# Patient Record
Sex: Female | Born: 1955 | Race: Black or African American | Hispanic: No | Smoking: Never smoker
Health system: Southern US, Community
[De-identification: ages and names within clinical notes are randomized; demographics above are authoritative.]

## PROBLEM LIST (undated history)

## (undated) DIAGNOSIS — M8580 Other specified disorders of bone density and structure, unspecified site: Secondary | ICD-10-CM

## (undated) DIAGNOSIS — E785 Hyperlipidemia, unspecified: Secondary | ICD-10-CM

## (undated) DIAGNOSIS — H269 Unspecified cataract: Secondary | ICD-10-CM

## (undated) HISTORY — PX: OTHER SURGICAL HISTORY: SHX169

## (undated) HISTORY — DX: Unspecified cataract: H26.9

## (undated) HISTORY — PX: TOTAL ABDOMINAL HYSTERECTOMY: SHX209

## (undated) HISTORY — DX: Hyperlipidemia, unspecified: E78.5

## (undated) HISTORY — PX: CATARACT EXTRACTION: SUR2

## (undated) HISTORY — DX: Other specified disorders of bone density and structure, unspecified site: M85.80

---

## 1999-03-03 ENCOUNTER — Inpatient Hospital Stay (HOSPITAL_COMMUNITY): Admission: RE | Admit: 1999-03-03 | Discharge: 1999-03-05 | Payer: Self-pay | Admitting: Obstetrics and Gynecology

## 1999-11-18 ENCOUNTER — Encounter: Payer: Self-pay | Admitting: Internal Medicine

## 1999-11-18 ENCOUNTER — Encounter: Admission: RE | Admit: 1999-11-18 | Discharge: 1999-11-18 | Payer: Self-pay | Admitting: Internal Medicine

## 2000-11-19 ENCOUNTER — Encounter: Payer: Self-pay | Admitting: Internal Medicine

## 2000-11-19 ENCOUNTER — Encounter: Admission: RE | Admit: 2000-11-19 | Discharge: 2000-11-19 | Payer: Self-pay | Admitting: Internal Medicine

## 2003-01-15 ENCOUNTER — Encounter: Admission: RE | Admit: 2003-01-15 | Discharge: 2003-01-15 | Payer: Self-pay | Admitting: Internal Medicine

## 2003-01-15 ENCOUNTER — Encounter: Payer: Self-pay | Admitting: Internal Medicine

## 2004-11-10 ENCOUNTER — Ambulatory Visit: Payer: Self-pay | Admitting: Internal Medicine

## 2007-11-09 ENCOUNTER — Telehealth: Payer: Self-pay | Admitting: Internal Medicine

## 2007-11-11 ENCOUNTER — Encounter: Payer: Self-pay | Admitting: Internal Medicine

## 2007-11-14 ENCOUNTER — Ambulatory Visit: Payer: Self-pay | Admitting: Internal Medicine

## 2007-11-14 ENCOUNTER — Telehealth: Payer: Self-pay | Admitting: Internal Medicine

## 2007-11-14 DIAGNOSIS — E782 Mixed hyperlipidemia: Secondary | ICD-10-CM

## 2007-11-14 DIAGNOSIS — E7800 Pure hypercholesterolemia, unspecified: Secondary | ICD-10-CM | POA: Insufficient documentation

## 2007-11-14 LAB — CONVERTED CEMR LAB: Vit D, 1,25-Dihydroxy: 22 — ABNORMAL LOW (ref 30–89)

## 2007-11-21 ENCOUNTER — Ambulatory Visit: Payer: Self-pay | Admitting: Gastroenterology

## 2007-11-21 ENCOUNTER — Encounter (INDEPENDENT_AMBULATORY_CARE_PROVIDER_SITE_OTHER): Payer: Self-pay | Admitting: *Deleted

## 2007-11-21 LAB — CONVERTED CEMR LAB
ALT: 18 units/L (ref 0–35)
AST: 18 units/L (ref 0–37)
Albumin: 4.1 g/dL (ref 3.5–5.2)
Alkaline Phosphatase: 43 units/L (ref 39–117)
BUN: 11 mg/dL (ref 6–23)
Basophils Absolute: 0 10*3/uL (ref 0.0–0.1)
Basophils Relative: 0.7 % (ref 0.0–1.0)
Bilirubin, Direct: 0.1 mg/dL (ref 0.0–0.3)
CO2: 28 meq/L (ref 19–32)
Calcium: 9.5 mg/dL (ref 8.4–10.5)
Chloride: 102 meq/L (ref 96–112)
Cholesterol: 245 mg/dL (ref 0–200)
Creatinine, Ser: 0.8 mg/dL (ref 0.4–1.2)
Direct LDL: 171.8 mg/dL
Eosinophils Absolute: 0.2 10*3/uL (ref 0.0–0.6)
Eosinophils Relative: 3.4 % (ref 0.0–5.0)
GFR calc Af Amer: 96 mL/min
GFR calc non Af Amer: 80 mL/min
Glucose, Bld: 97 mg/dL (ref 70–99)
HCT: 42.3 % (ref 36.0–46.0)
HDL: 53.2 mg/dL (ref >39.0)
Hemoglobin: 14.8 g/dL (ref 12.0–15.0)
Lymphocytes Relative: 32.9 % (ref 12.0–46.0)
MCHC: 35 g/dL (ref 30.0–36.0)
MCV: 90.4 fL (ref 78.0–100.0)
Monocytes Absolute: 0.4 10*3/uL (ref 0.2–0.7)
Monocytes Relative: 6.4 % (ref 3.0–11.0)
Neutro Abs: 4 10*3/uL (ref 1.4–7.7)
Neutrophils Relative %: 56.6 % (ref 43.0–77.0)
Platelets: 263 10*3/uL (ref 150–400)
Potassium: 4 meq/L (ref 3.5–5.1)
RBC: 4.68 M/uL (ref 3.87–5.11)
RDW: 11.9 % (ref 11.5–14.6)
Sodium: 139 meq/L (ref 135–145)
TSH: 1.29 microintl units/mL (ref 0.35–5.50)
Total Bilirubin: 0.7 mg/dL (ref 0.3–1.2)
Total CHOL/HDL Ratio: 4.6
Total Protein: 7.8 g/dL (ref 6.0–8.3)
Triglycerides: 72 mg/dL (ref 0–149)
VLDL: 14 mg/dL (ref 0–40)
WBC: 6.9 10*3/uL (ref 4.5–10.5)

## 2007-11-30 ENCOUNTER — Ambulatory Visit: Payer: Self-pay | Admitting: Gastroenterology

## 2007-11-30 ENCOUNTER — Encounter: Payer: Self-pay | Admitting: Gastroenterology

## 2007-11-30 ENCOUNTER — Encounter: Payer: Self-pay | Admitting: Internal Medicine

## 2007-11-30 LAB — HM COLONOSCOPY

## 2008-03-28 ENCOUNTER — Telehealth (INDEPENDENT_AMBULATORY_CARE_PROVIDER_SITE_OTHER): Payer: Self-pay | Admitting: *Deleted

## 2008-11-12 ENCOUNTER — Ambulatory Visit: Payer: Self-pay | Admitting: Internal Medicine

## 2008-11-12 DIAGNOSIS — R030 Elevated blood-pressure reading, without diagnosis of hypertension: Secondary | ICD-10-CM | POA: Insufficient documentation

## 2008-11-12 DIAGNOSIS — M8580 Other specified disorders of bone density and structure, unspecified site: Secondary | ICD-10-CM | POA: Insufficient documentation

## 2008-11-12 LAB — CONVERTED CEMR LAB
Cholesterol, target level: 200 mg/dL
HDL goal, serum: 50 mg/dL
LDL Goal: 135 mg/dL

## 2008-11-13 ENCOUNTER — Ambulatory Visit: Payer: Self-pay | Admitting: Family Medicine

## 2008-11-13 ENCOUNTER — Encounter: Payer: Self-pay | Admitting: Internal Medicine

## 2008-11-14 ENCOUNTER — Encounter (INDEPENDENT_AMBULATORY_CARE_PROVIDER_SITE_OTHER): Payer: Self-pay | Admitting: *Deleted

## 2008-11-14 LAB — CONVERTED CEMR LAB: Vit D, 1,25-Dihydroxy: 35 (ref 30–89)

## 2008-11-15 ENCOUNTER — Telehealth (INDEPENDENT_AMBULATORY_CARE_PROVIDER_SITE_OTHER): Payer: Self-pay | Admitting: *Deleted

## 2008-11-15 LAB — CONVERTED CEMR LAB
ALT: 15 units/L (ref 0–35)
AST: 18 units/L (ref 0–37)
Albumin: 4 g/dL (ref 3.5–5.2)
Alkaline Phosphatase: 53 units/L (ref 39–117)
BUN: 13 mg/dL (ref 6–23)
Basophils Absolute: 0.1 10*3/uL (ref 0.0–0.1)
Basophils Relative: 0.9 % (ref 0.0–3.0)
Bilirubin, Direct: 0.1 mg/dL (ref 0.0–0.3)
CO2: 32 meq/L (ref 19–32)
Calcium: 9.8 mg/dL (ref 8.4–10.5)
Chloride: 105 meq/L (ref 96–112)
Cholesterol: 223 mg/dL (ref 0–200)
Creatinine, Ser: 0.8 mg/dL (ref 0.4–1.2)
Direct LDL: 162.6 mg/dL
Eosinophils Absolute: 0.3 10*3/uL (ref 0.0–0.7)
Eosinophils Relative: 3.6 % (ref 0.0–5.0)
GFR calc Af Amer: 96 mL/min
GFR calc non Af Amer: 79 mL/min
Glucose, Bld: 103 mg/dL — ABNORMAL HIGH (ref 70–99)
HCT: 42.7 % (ref 36.0–46.0)
HDL: 44.1 mg/dL (ref >39.0)
Hemoglobin: 14.8 g/dL (ref 12.0–15.0)
Hgb A1c MFr Bld: 5.8 % (ref 4.6–6.0)
Lymphocytes Relative: 34.4 % (ref 12.0–46.0)
MCHC: 34.6 g/dL (ref 30.0–36.0)
MCV: 90.8 fL (ref 78.0–100.0)
Monocytes Absolute: 0.5 10*3/uL (ref 0.1–1.0)
Monocytes Relative: 6.8 % (ref 3.0–12.0)
Neutro Abs: 3.8 10*3/uL (ref 1.4–7.7)
Neutrophils Relative %: 54.3 % (ref 43.0–77.0)
Platelets: 271 10*3/uL (ref 150–400)
Potassium: 4.8 meq/L (ref 3.5–5.1)
RBC: 4.7 M/uL (ref 3.87–5.11)
RDW: 11.7 % (ref 11.5–14.6)
Sodium: 142 meq/L (ref 135–145)
TSH: 1.13 microintl units/mL (ref 0.35–5.50)
Total Bilirubin: 1 mg/dL (ref 0.3–1.2)
Total CHOL/HDL Ratio: 5.1
Total Protein: 7.8 g/dL (ref 6.0–8.3)
Triglycerides: 75 mg/dL (ref 0–149)
VLDL: 15 mg/dL (ref 0–40)
WBC: 7.2 10*3/uL (ref 4.5–10.5)

## 2008-11-19 ENCOUNTER — Encounter (INDEPENDENT_AMBULATORY_CARE_PROVIDER_SITE_OTHER): Payer: Self-pay | Admitting: *Deleted

## 2008-12-10 ENCOUNTER — Encounter (INDEPENDENT_AMBULATORY_CARE_PROVIDER_SITE_OTHER): Payer: Self-pay | Admitting: *Deleted

## 2010-08-14 ENCOUNTER — Ambulatory Visit: Payer: Self-pay | Admitting: Internal Medicine

## 2010-08-14 DIAGNOSIS — E559 Vitamin D deficiency, unspecified: Secondary | ICD-10-CM | POA: Insufficient documentation

## 2010-08-15 ENCOUNTER — Ambulatory Visit: Payer: Self-pay | Admitting: Internal Medicine

## 2010-08-15 ENCOUNTER — Encounter: Admission: RE | Admit: 2010-08-15 | Discharge: 2010-08-15 | Payer: Self-pay | Admitting: Obstetrics and Gynecology

## 2010-08-18 LAB — CONVERTED CEMR LAB
Albumin: 4 g/dL (ref 3.5–5.2)
Alkaline Phosphatase: 49 units/L (ref 39–117)
Basophils Absolute: 0.1 10*3/uL (ref 0.0–0.1)
Basophils Relative: 1.1 % (ref 0.0–3.0)
Bilirubin, Direct: 0.1 mg/dL (ref 0.0–0.3)
CO2: 28 meq/L (ref 19–32)
Calcium: 9.6 mg/dL (ref 8.4–10.5)
Cholesterol: 214 mg/dL — ABNORMAL HIGH (ref 0–200)
Creatinine, Ser: 0.8 mg/dL (ref 0.4–1.2)
GFR calc non Af Amer: 92.72 mL/min (ref >60)
HCT: 40.1 % (ref 36.0–46.0)
HDL: 50.1 mg/dL (ref >39.00)
Hemoglobin: 13.7 g/dL (ref 12.0–15.0)
Lymphocytes Relative: 31.7 % (ref 12.0–46.0)
Lymphs Abs: 2.1 10*3/uL (ref 0.7–4.0)
MCHC: 34 g/dL (ref 30.0–36.0)
Monocytes Relative: 7.1 % (ref 3.0–12.0)
Neutro Abs: 3.6 10*3/uL (ref 1.4–7.7)
RBC: 4.37 M/uL (ref 3.87–5.11)
RDW: 12.6 % (ref 11.5–14.6)
Sodium: 142 meq/L (ref 135–145)
Total CHOL/HDL Ratio: 4
Total Protein: 7 g/dL (ref 6.0–8.3)
Triglycerides: 57 mg/dL (ref 0.0–149.0)

## 2010-09-01 ENCOUNTER — Ambulatory Visit: Payer: Self-pay | Admitting: Internal Medicine

## 2010-09-01 ENCOUNTER — Encounter (INDEPENDENT_AMBULATORY_CARE_PROVIDER_SITE_OTHER): Payer: Self-pay | Admitting: *Deleted

## 2010-09-01 LAB — CONVERTED CEMR LAB
OCCULT 1: NEGATIVE
OCCULT 2: NEGATIVE

## 2010-11-23 HISTORY — PX: COLONOSCOPY: SHX174

## 2010-12-14 ENCOUNTER — Encounter: Payer: Self-pay | Admitting: Obstetrics and Gynecology

## 2010-12-23 NOTE — Letter (Signed)
Summary: Results Follow up Letter  [facility]  [address] [place], [place] [address]   Phone: [phone]  Fax: [phone]    09/01/2010 MRN: [id number]  KORY PANJWANI [address] [place], [place]  [address]  Dear Ms. Michaelsen,  The following are the results of your recent test(s):  Test         Result    Pap Smear:        Normal _____  Not Normal _____ Comments: ______________________________________________________ Cholesterol: LDL(Bad cholesterol):         Your goal is less than:         HDL (Good cholesterol):       Your goal is more than: Comments:  ______________________________________________________ Mammogram:        Normal _____  Not Normal _____ Comments:  ___________________________________________________________________ Hemoccult:        Normal __X___  Not normal _______ Comments:    _____________________________________________________________________ Other Tests:    We routinely do not discuss normal results over the telephone.  If you desire a copy of the results, or you have any questions about this information we can discuss them at your next office visit.   Sincerely,

## 2010-12-23 NOTE — Assessment & Plan Note (Signed)
Summary: cpx/cbs   Vital Signs:  Patient profile:   55 year old female Height:      66.25 inches Weight:      182.6 pounds BMI:     29.36 Temp:     98.5 degrees F oral Pulse rate:   72 / minute Resp:     14 per minute BP sitting:   120 / 78  (left arm) Cuff size:   large  Vitals Entered By: Shonna Chock CMA (August 14, 2010 2:12 PM)  CC: Lipid Management Comments Refused Flu Vaccine- bad reaction in the past    CC:  Lipid Management.  History of Present Illness: Sheila Solomon is here for a physical; she is asymptomatic.  Lipid Management History:      Positive NCEP/ATP III risk factors include female age 13 years old or older.  Negative NCEP/ATP III risk factors include non-diabetic, no family history for ischemic heart disease, non-tobacco-user status, non-hypertensive, no ASHD (atherosclerotic heart disease), no prior stroke/TIA, and no peripheral vascular disease.     Current Medications (verified): 1)  Multivitamin .... Once Daily 2)  Calcium 1000 .Marland Kitchen.. 1 By Mouth Once Daily 3)  Vit E .... Prn 4)  Vitamin D3 1000 Unit Tabs (Cholecalciferol) .Marland Kitchen.. 1 By Mouth Once Daily 5)  Fish Oil 1000 Mg Caps (Omega-3 Fatty Acids) .Marland Kitchen.. 1 By Mouth Once Daily  Allergies (verified): 1)  ! Prednisone (Prednisone)  Past History:  Past Medical History: Hypertension, PMH of ;elevated BP w/o diagnosis of HTN post menopausal status Hyperlipidemia: NMR Lipoprofile 2005: LDL 167(1733/1093), HDL 52, TG 75. LDL goal = < 135. Framingham Study LDL goal = < 160. Osteopenia: BMD 10/2008:T score  -1.2 @  femoral neck; Vitamin D deficiency ( vit D level 22 in 2008)  Past Surgical History: cataract surgery ( laser & implants) bilaterally G 1 P 1 Hysterectomy (TAH)  for fibroids (no BSO) 1997, Dr Elana Alm Colonoscopy 2009: mild colitis (  see Path  report), Dr Russella Dar  Family History: Mother: migraines,HTN, angioplasty, triple bypass @ 34; Father: DM, prostate cancer S/P seed implant; sister: HTN,  DM paternal grandmother: cva @ 96  Social History: Never Smoked Alcohol use-no Occupation: Systems analyst for [employer] Regular exercise: walking & cardio 3X/week  Review of Systems  The patient denies anorexia, fever, weight loss, weight gain, vision loss, decreased hearing, hoarseness, chest pain, syncope, dyspnea on exertion, peripheral edema, prolonged cough, headaches, hemoptysis, abdominal pain, melena, hematochezia, severe indigestion/heartburn, hematuria, suspicious skin lesions, depression, unusual weight change, abnormal bleeding, enlarged lymph nodes, and angioedema.         Occasional vesicular rash on buttocks, < 2X year.  Physical Exam  General:  well-nourished; alert,appropriate and cooperative throughout examination Head:  Normocephalic and atraumatic without obvious abnormalities.  Eyes:  No corneal or conjunctival inflammation noted. Perrla. Funduscopic exam benign, without hemorrhages, exudates or papilledema.  Ears:  External ear exam shows no significant lesions or deformities.  Otoscopic examination reveals clear canals, tympanic membranes are intact bilaterally without bulging, retraction, inflammation or discharge. Hearing is grossly normal bilaterally. Nose:  External nasal examination shows no deformity or inflammation. Nasal mucosa are pink and moist without lesions or exudates. Mouth:  Oral mucosa and oropharynx without lesions or exudates.  Teeth in good repair. Neck:  No deformities, masses, or tenderness noted. Lungs:  Normal respiratory effort, chest expands symmetrically. Lungs are clear to auscultation, no crackles or wheezes. Heart:  Normal rate and regular rhythm. S1 and S2 normal without gallop,  murmur, click, rub.S4 Abdomen:  Bowel sounds positive,abdomen soft and non-tender without masses, organomegaly or hernias noted. Genitalia:  Dr Elana Alm Msk:  No deformity or scoliosis noted of thoracic or lumbar spine.   Pulses:  R and L  carotid,radial,dorsalis pedis and posterior tibial pulses are full and equal bilaterally Extremities:  No clubbing, cyanosis, edema, or deformity noted with normal full range of motion of all joints.   Neurologic:  alert & oriented X3 and DTRs symmetrical and normal.   Skin:  Intact without suspicious lesions or rashes Cervical Nodes:  No lymphadenopathy noted Axillary Nodes:  No palpable lymphadenopathy Psych:  memory intact for recent and remote, normally interactive, and good eye contact.     Impression & Recommendations:  Problem # 1:  ROUTINE GENERAL MEDICAL EXAM@HEALTH  CARE FACL (ICD-V70.0)  Orders: EKG w/ Interpretation (93000)  Problem # 2:  HYPERLIPIDEMIA (ICD-272.2) Statin never started The following medications were removed from the medication list:    Pravastatin Sodium 40 Mg Tabs (Pravastatin sodium) .Marland Kitchen... 1 at bedtime  Problem # 3:  OSTEOPENIA (ICD-733.90) BMD due in 2012  Problem # 4:  VITAMIN D DEFICIENCY (ICD-268.9)  Complete Medication List: 1)  Multivitamin  .... Once daily 2)  Calcium 1000  .Marland KitchenMarland Kitchen. 1 by mouth once daily 3)  Vit E  .... Prn 4)  Vitamin D3 1000 Unit Tabs (Cholecalciferol) .Marland Kitchen.. 1 by mouth once daily 5)  Fish Oil 1000 Mg Caps (Omega-3 fatty acids) .Marland Kitchen.. 1 by mouth once daily  Lipid Assessment/Plan:      Based on NCEP/ATP III, the patient's risk factor category is "0-1 risk factors".  The patient's lipid goals are as follows: Total cholesterol goal is 200; LDL cholesterol goal is 135; HDL cholesterol goal is 50; Triglyceride goal is 150.  Her LDL cholesterol goal has not been met.  Secondary causes for hyperlipidemia have been ruled out.  She has been counseled on adjunctive measures for lowering her cholesterol and has been provided with dietary instructions.    Patient Instructions: 1)  Schedule fasting labs; see Diagnoses for Codes: vitamin D level; 2)  BMP ; 3)  Hepatic Panel ; 4)  Lipid Panel ; 5)  TSH ; 6)  CBC w/ Diff .

## 2011-04-10 NOTE — Assessment & Plan Note (Signed)
[facility]                                 ON-CALL NOTE   Sheila, Solomon                       MRN:          [id number]  DATE:12/31/2007                            DOB:          29-Jul-1956    TIME OF CALL:  9:26 a.m.   CALLER:  Marvelyn Bouchillon   She sees Dr. Alwyn Ren.   PHONE NUMBER:  [phone].   The patient states that she has had several days of congestion in the  head and in the chest with some dry coughing.  No fever.  We offered to  see her in our clinic this morning, but she declined, and instead stated  that she would try something over the counter and drink fluids, and  contact Dr. Alwyn Ren on Monday if she is no better.  I told her she could  certainly call me back this weekend if things changed.     Tera Mater. Clent Ridges, MD  Electronically Signed    SAF/MedQ  DD: 12/31/2007  DT: 01/02/2008  Job #: [id number]

## 2011-04-17 ENCOUNTER — Telehealth: Payer: Self-pay | Admitting: Internal Medicine

## 2011-04-23 NOTE — Telephone Encounter (Signed)
error 

## 2011-06-23 ENCOUNTER — Telehealth: Payer: Self-pay | Admitting: Internal Medicine

## 2011-06-24 NOTE — Telephone Encounter (Signed)
Message left for patient to return my call.  

## 2011-06-25 NOTE — Telephone Encounter (Signed)
Message left for patient to return my call.  

## 2011-06-26 NOTE — Telephone Encounter (Signed)
Discussed form over w/ pt her CPX 08/14/10, which are the dates we put on her form.

## 2011-07-06 ENCOUNTER — Telehealth: Payer: Self-pay | Admitting: Internal Medicine

## 2011-07-07 NOTE — Telephone Encounter (Signed)
Spoke w/ pt needed to have repeat labs for insurance appt scheduled.

## 2011-07-30 ENCOUNTER — Other Ambulatory Visit: Payer: Self-pay | Admitting: Internal Medicine

## 2011-07-30 DIAGNOSIS — Z0000 Encounter for general adult medical examination without abnormal findings: Secondary | ICD-10-CM

## 2011-07-31 ENCOUNTER — Other Ambulatory Visit (INDEPENDENT_AMBULATORY_CARE_PROVIDER_SITE_OTHER): Payer: Managed Care, Other (non HMO)

## 2011-07-31 DIAGNOSIS — Z0000 Encounter for general adult medical examination without abnormal findings: Secondary | ICD-10-CM

## 2011-07-31 LAB — CBC WITH DIFFERENTIAL/PLATELET
Basophils Absolute: 0.1 10*3/uL (ref 0.0–0.1)
HCT: 41 % (ref 36.0–46.0)
Hemoglobin: 13.6 g/dL (ref 12.0–15.0)
Lymphs Abs: 3.2 10*3/uL (ref 0.7–4.0)
MCV: 91.7 fl (ref 78.0–100.0)
Monocytes Absolute: 0.7 10*3/uL (ref 0.1–1.0)
Monocytes Relative: 8.3 % (ref 3.0–12.0)
Neutro Abs: 4.6 10*3/uL (ref 1.4–7.7)
Platelets: 273 10*3/uL (ref 150.0–400.0)
RDW: 12.9 % (ref 11.5–14.6)

## 2011-07-31 LAB — HEPATIC FUNCTION PANEL
ALT: 17 U/L (ref 0–35)
AST: 18 U/L (ref 0–37)
Albumin: 4.1 g/dL (ref 3.5–5.2)
Alkaline Phosphatase: 50 U/L (ref 39–117)
Bilirubin, Direct: 0.1 mg/dL (ref 0.0–0.3)
Total Protein: 7.6 g/dL (ref 6.0–8.3)

## 2011-07-31 LAB — BASIC METABOLIC PANEL
CO2: 29 mEq/L (ref 19–32)
Chloride: 103 mEq/L (ref 96–112)
GFR: 123 mL/min (ref >60.00)
Glucose, Bld: 87 mg/dL (ref 70–99)
Potassium: 3.6 mEq/L (ref 3.5–5.1)
Sodium: 139 mEq/L (ref 135–145)

## 2011-07-31 LAB — LIPID PANEL: HDL: 54.6 mg/dL (ref >39.00)

## 2011-07-31 NOTE — Progress Notes (Signed)
Labs only

## 2011-08-04 NOTE — Progress Notes (Signed)
Labs only

## 2011-08-05 ENCOUNTER — Telehealth: Payer: Self-pay | Admitting: Internal Medicine

## 2011-08-06 NOTE — Telephone Encounter (Signed)
Spoke with patient, patient states she faxed a form 1-2 weeks ago that needs to be filled out based on paperwork and sent to [facility] ASAP, patient stated she is still in town and will stop by to pick up copy of labs

## 2011-08-06 NOTE — Telephone Encounter (Signed)
Form located, completed and faxed to [place] [phone]

## 2012-01-05 ENCOUNTER — Other Ambulatory Visit: Payer: Self-pay | Admitting: Internal Medicine

## 2012-01-05 DIAGNOSIS — Z1231 Encounter for screening mammogram for malignant neoplasm of breast: Secondary | ICD-10-CM

## 2012-01-08 ENCOUNTER — Encounter: Payer: Self-pay | Admitting: Internal Medicine

## 2012-01-08 ENCOUNTER — Ambulatory Visit
Admission: RE | Admit: 2012-01-08 | Discharge: 2012-01-08 | Disposition: A | Discharge status: Home / Self Care | Payer: Managed Care, Other (non HMO) | Source: Ambulatory Visit | Attending: Internal Medicine | Admitting: Internal Medicine

## 2012-01-08 ENCOUNTER — Ambulatory Visit (INDEPENDENT_AMBULATORY_CARE_PROVIDER_SITE_OTHER): Payer: Managed Care, Other (non HMO) | Admitting: Internal Medicine

## 2012-01-08 ENCOUNTER — Ambulatory Visit (INDEPENDENT_AMBULATORY_CARE_PROVIDER_SITE_OTHER)
Admission: RE | Admit: 2012-01-08 | Discharge: 2012-01-08 | Disposition: A | Discharge status: Home / Self Care | Payer: Managed Care, Other (non HMO) | Source: Ambulatory Visit

## 2012-01-08 ENCOUNTER — Encounter: Payer: Managed Care, Other (non HMO) | Admitting: Internal Medicine

## 2012-01-08 DIAGNOSIS — E782 Mixed hyperlipidemia: Secondary | ICD-10-CM

## 2012-01-08 DIAGNOSIS — M949 Disorder of cartilage, unspecified: Secondary | ICD-10-CM

## 2012-01-08 DIAGNOSIS — Z0000 Encounter for general adult medical examination without abnormal findings: Secondary | ICD-10-CM

## 2012-01-08 DIAGNOSIS — E559 Vitamin D deficiency, unspecified: Secondary | ICD-10-CM

## 2012-01-08 DIAGNOSIS — Z1231 Encounter for screening mammogram for malignant neoplasm of breast: Secondary | ICD-10-CM

## 2012-01-08 DIAGNOSIS — IMO0001 Reserved for inherently not codable concepts without codable children: Secondary | ICD-10-CM

## 2012-01-08 LAB — CBC WITH DIFFERENTIAL/PLATELET
Basophils Absolute: 0.1 10*3/uL (ref 0.0–0.1)
HCT: 44.3 % (ref 36.0–46.0)
Hemoglobin: 14.8 g/dL (ref 12.0–15.0)
Lymphs Abs: 3 10*3/uL (ref 0.7–4.0)
MCV: 92 fl (ref 78.0–100.0)
Monocytes Relative: 6.4 % (ref 3.0–12.0)
Neutro Abs: 4.4 10*3/uL (ref 1.4–7.7)
RDW: 13 % (ref 11.5–14.6)

## 2012-01-08 LAB — HEPATIC FUNCTION PANEL
ALT: 20 U/L (ref 0–35)
AST: 19 U/L (ref 0–37)
Albumin: 4.2 g/dL (ref 3.5–5.2)

## 2012-01-08 LAB — BASIC METABOLIC PANEL
CO2: 29 mEq/L (ref 19–32)
Chloride: 103 mEq/L (ref 96–112)
GFR: 102.27 mL/min (ref >60.00)
Glucose, Bld: 84 mg/dL (ref 70–99)
Potassium: 3.9 mEq/L (ref 3.5–5.1)
Sodium: 140 mEq/L (ref 135–145)

## 2012-01-08 LAB — LIPID PANEL
Cholesterol: 222 mg/dL — ABNORMAL HIGH (ref 0–200)
VLDL: 18.6 mg/dL (ref 0.0–40.0)

## 2012-01-08 NOTE — Patient Instructions (Signed)
Preventive Health Care: Exercise  30-45  minutes a day, 3-4 days a week. Walking is especially valuable in preventing Osteoporosis. Health Care Power of Attorney & Living Will place you in charge of your health care  decisions. Verify these are  in place. Eat a low-fat diet with lots of fruits and vegetables, up to 7-9 servings per day. Consume less than 30 (preferably ZERO) grams of sugar per day from foods & drinks with High Fructose Corn Syrup (HFCS) sugar as #1,2,3 or # 4 on label.Whole Foods, [employer] Joes & [employer] do not carry products with HFCS. Follow a  low carb nutrition program such as [place] or The New Sugar Busters  to prevent Diabetes progression . White carbohydrates (potatoes, rice, bread, and pasta) have a high spike of sugar and a high load of sugar. For example a  baked potato has a cup of sugar and a  french fry  2 teaspoons of sugar. Yams, wild  rice, whole grained bread &  wheat pasta have been much lower spike and load of  sugar. Portions should be the size of a deck of cards or your palm.

## 2012-01-08 NOTE — Progress Notes (Signed)
Subjective:    Patient ID: Sheila Solomon, female    DOB: 1956/03/03, 56 y.o.   MRN: [id number]  HPI  Sheila Solomon  is here for a physical; she denies acute issues .      Review of Systems Patient reports no significant  vision/ hearing  changes, adenopathy,fever, weight change,  persistant / recurrent hoarseness , swallowing issues, chest pain,palpitations,edema,persistant /recurrent cough, hemoptysis, dyspnea( rest/ exertional/paroxysmal nocturnal), gastrointestinal bleeding(melena, rectal bleeding), abdominal pain, significant heartburn,  bowel changes,GU symptoms(dysuria, hematuria,pyuria, incontinence), Gyn symptoms(abnormal  bleeding , pain),  syncope, focal weakness, memory loss,numbness & tingling, skin/hair /nail changes,abnormal bruising or bleeding, anxiety,or depression.   She has had some hot flashes; she is using natural supplements as therapy.     Objective:   Physical Exam Gen.: Healthy and well-nourished in appearance. Alert, appropriate and cooperative throughout exam. Head: Normocephalic without obvious abnormalities Eyes: No corneal or conjunctival inflammation noted. Pupils equal round reactive to light and accommodation. Fundal exam is benign without hemorrhages, exudate, papilledema. Extraocular motion intact. Vision grossly normal. Ears: External  ear exam reveals no significant lesions or deformities. Canals clear .TMs normal. Hearing is grossly normal bilaterally. Nose: External nasal exam reveals no deformity or inflammation. Nasal mucosa are pink and moist. No lesions or exudates noted.   Mouth: Oral mucosa and oropharynx reveal no lesions or exudates. Teeth in good repair. Neck: No deformities, masses, or tenderness noted. Range of motion & Thyroid normal. Lungs: Normal respiratory effort; chest expands symmetrically. Lungs are clear to auscultation without rales, wheezes, or increased work of breathing. Heart: Normal rate and rhythm. Normal S1 and S2. No gallop,  click, or rub. S4 with slight slurring at  LSB; no   murmur. Abdomen: Bowel sounds normal; abdomen soft and nontender. No masses, organomegaly or hernias noted. Genitalia: to establish with Gyn   .                                                                                   Musculoskeletal/extremities: No deformity or scoliosis noted of  the thoracic or lumbar spine. No clubbing, cyanosis, edema, or deformity noted. Range of motion  normal .Tone & strength  normal.Joints normal. Nail health  Good. Minor crepitus L knee  Vascular: Carotid, radial artery, dorsalis pedis and  posterior tibial pulses are full and equal. No bruits present. Neurologic: Alert and oriented x3. Deep tendon reflexes symmetrical and normal.          Skin: Intact without suspicious lesions or rashes. Lymph: No cervical, axillary lymphadenopathy present. Psych: Mood and affect are normal. Normally interactive                                                                                        Assessment & Plan:  #1 comprehensive physical exam; no acute findings #2 see Problem List with  Assessments & Recommendations Plan: see Orders

## 2012-01-13 ENCOUNTER — Encounter: Payer: Self-pay | Admitting: Internal Medicine

## 2012-01-13 ENCOUNTER — Telehealth: Payer: Self-pay

## 2012-01-13 DIAGNOSIS — F419 Anxiety disorder, unspecified: Secondary | ICD-10-CM

## 2012-01-13 MED ORDER — PRAVASTATIN SODIUM 20 MG PO TABS: 20.0000 mg | ORAL_TABLET | Freq: Every day | ORAL | Status: DC

## 2012-01-13 NOTE — Telephone Encounter (Signed)
RX sent with copy of form

## 2012-01-13 NOTE — Telephone Encounter (Signed)
Message copied by Maurice Small on Wed Jan 13, 2012  8:32 AM ------      Message from: Pecola Lawless      Created: Sun Jan 10, 2012  9:40 AM       Please send a prescription for Pravastatin 20 mg; dispense 90, daily qhs with labs & her job form

## 2012-02-11 ENCOUNTER — Encounter: Payer: Self-pay | Admitting: Obstetrics and Gynecology

## 2012-10-10 ENCOUNTER — Telehealth: Payer: Self-pay | Admitting: Internal Medicine

## 2012-10-10 NOTE — Telephone Encounter (Signed)
pt called wants a CPE 12.23.13, advised we are in Feb.2014 now for CPE she stated she is always worked in and needs a cpe on this day. If approved where would you like her ?    Cb# [phone]

## 2012-10-11 NOTE — Telephone Encounter (Signed)
Please  schedule fasting Labs that am: BMET,Lipids, hepatic panel, CBC & dif, TSH. CPX @ 4:30 pm . Code: V70.0.

## 2012-10-11 NOTE — Telephone Encounter (Signed)
Done

## 2012-11-14 ENCOUNTER — Encounter: Payer: Self-pay | Admitting: Internal Medicine

## 2012-11-14 ENCOUNTER — Ambulatory Visit (INDEPENDENT_AMBULATORY_CARE_PROVIDER_SITE_OTHER): Payer: Managed Care, Other (non HMO) | Admitting: Internal Medicine

## 2012-11-14 ENCOUNTER — Telehealth: Payer: Self-pay | Admitting: Internal Medicine

## 2012-11-14 ENCOUNTER — Other Ambulatory Visit (INDEPENDENT_AMBULATORY_CARE_PROVIDER_SITE_OTHER): Payer: Managed Care, Other (non HMO)

## 2012-11-14 VITALS — BP 126/74 | HR 81 | Temp 98.1°F | Resp 16 | Ht 67.0 in | Wt 183.0 lb

## 2012-11-14 DIAGNOSIS — E782 Mixed hyperlipidemia: Secondary | ICD-10-CM

## 2012-11-14 DIAGNOSIS — F419 Anxiety disorder, unspecified: Secondary | ICD-10-CM

## 2012-11-14 DIAGNOSIS — F411 Generalized anxiety disorder: Secondary | ICD-10-CM

## 2012-11-14 DIAGNOSIS — E559 Vitamin D deficiency, unspecified: Secondary | ICD-10-CM

## 2012-11-14 DIAGNOSIS — M6580 Other synovitis and tenosynovitis, unspecified site: Secondary | ICD-10-CM

## 2012-11-14 DIAGNOSIS — M659 Synovitis and tenosynovitis, unspecified: Secondary | ICD-10-CM

## 2012-11-14 LAB — TSH: TSH: 0.84 u[IU]/mL (ref 0.35–5.50)

## 2012-11-14 LAB — T3, FREE: T3, Free: 2.7 pg/mL (ref 2.3–4.2)

## 2012-11-14 NOTE — Patient Instructions (Addendum)
Perform the exercises for elbow pain  twice a day as discussed. Apply the anti-inflammatory cream after the exercises such as Aspercreme or Zostrix cream twice a day to the left elbow as needed. In lieu of this warm moist compresses or  hot water bottle can be used. Do not apply ice . Consider glucosamine sulfate 1500 mg daily   for 4-6 weeks . This will rehydrate the cartilages.  Add 1000 IU vitamin D3 one pill daily to present daily dose in MVI. Recheck vit D level annually .  If you activate My Chart; the results can be released to you as soon as they populate from the lab. If you choose not to use this program; the labs have to be reviewed, copied & mailed  causing a delay in getting the results to you.

## 2012-11-14 NOTE — Progress Notes (Signed)
  Subjective:    Patient ID: Sheila Solomon, female    DOB: [birth year], 56 y.o.   MRN: [id number]  HPI  Ms.Peltz is here for elbow pain.     Review of Systems  Extremity pain Location:Left Onset:3 weeks ago Trigger/injury:? Pain quality:variable , dull to sharp Pain severity:up to 8 Duration: few minutes Radiation:no Exacerbating factors:blunt trauma, lifting luggage or other items. NOT related to exertion Treatment/response: alcohol rub w/o benefit   Review of systems: Constitutional: no fever, chills, sweats, change in weight  Musculoskeletal:no  muscle cramps or pain; no  joint stiffness, redness, or swelling Skin:no rash, color/ temp change Neuro: Weakness only with weights > 10#.No incontinence (stool/urine); no numbness and tingling Heme:no lymphadenopathy; abnormal bruising or bleeding        She exercises 60-120 minutes 3-4 times per week without symptoms; she is on a heart healthy diet. Specifically she denies chest pain, palpitations, dyspnea, or claudication. Family history is negative for premature coronary disease. Advanced cholesterol testing reveals her  LDL goal is less than 125. She did not fill the statin Rx.     Objective:   Physical Exam  Gen.: Healthy and well-nourished in appearance. Alert, appropriate and cooperative throughout exam.  Eyes: No corneal or conjunctival inflammation noted.  Extraocular motion intact.  Mouth: Oral mucosa and oropharynx reveal no lesions or exudates. Teeth in good repair. Neck: No deformities, masses, or tenderness noted. Range of motion & Thyroid normal. Lungs: Normal respiratory effort; chest expands symmetrically. Lungs are clear to auscultation without rales, wheezes, or increased work of breathing. Heart: Normal rate and rhythm. Normal S1 and S2. No gallop, click, or rub.S 4 w/o no murmur. Abdomen: Bowel sounds normal; abdomen soft and nontender. No masses, organomegaly or hernias noted.                           Musculoskeletal/extremities: No deformity or scoliosis noted of  the thoracic or lumbar spine. No clubbing, cyanosis, edema, or deformity noted. Range of motion  normal .Tone & strength  normal.Joints normal. Nail health  good. Classic and cellulitis of the left elbow demonstrated with hand grip and pronation. Vascular: Carotid, radial artery, dorsalis pedis and  posterior tibial pulses are full and equal. No bruits present. Neurologic: Alert and oriented x3. Deep tendon reflexes symmetrical and normal. Negative Tinel's      Skin: Intact without suspicious lesions or rashes. Lymph: No cervical, axillary lymphadenopathy present. Psych: Mood and affect are normal. Normally interactive                                                                                       Assessment & Plan:  #1 tenosynovitis #2 dyslipidemia #3 vitamin D deficiency  Plan: see Orders

## 2012-11-14 NOTE — Telephone Encounter (Signed)
Message copied by Verner Chol on Mon Nov 14, 2012 10:13 AM ------      Message from: Pecola Lawless      Created: Sun Nov 13, 2012  7:33 PM       Could she come in @ 1:30instead of 4:30 pm ? Dr Katheren Shams may come in @ 4:45 pm (uncontrolled HTN). Thanks, [employer]

## 2012-11-14 NOTE — Telephone Encounter (Signed)
pt agreed moved appt per hoppers note

## 2013-01-07 ENCOUNTER — Other Ambulatory Visit: Payer: Self-pay

## 2013-02-04 LAB — HM PAP SMEAR

## 2013-02-10 ENCOUNTER — Other Ambulatory Visit: Payer: Self-pay

## 2013-02-10 ENCOUNTER — Ambulatory Visit
Admission: RE | Admit: 2013-02-10 | Discharge: 2013-02-10 | Disposition: A | Discharge status: Home / Self Care | Payer: Managed Care, Other (non HMO) | Source: Ambulatory Visit

## 2013-02-10 DIAGNOSIS — Z1231 Encounter for screening mammogram for malignant neoplasm of breast: Secondary | ICD-10-CM

## 2013-09-28 ENCOUNTER — Other Ambulatory Visit: Payer: Self-pay

## 2013-11-09 ENCOUNTER — Telehealth: Payer: Self-pay | Admitting: Internal Medicine

## 2013-11-09 NOTE — Telephone Encounter (Signed)
Patient states that she has chest congestion a and runny nose and she is asking if Dr. Alwyn Ren can send an rx to her pharmacy instead of her keeping her appointment tomorrow. If this option is not available she is asking for an OTC recommendation. Please advise.

## 2013-11-09 NOTE — Telephone Encounter (Signed)
Please advise 

## 2013-11-09 NOTE — Telephone Encounter (Signed)
Vitamin C 2000 mg daily; & Echinacea for 4-7 days.  Plain Mucinex (NOT D) OR  Mucinex DM for thick secretions ;force NON dairy fluids .   Nasacort AQ OTC 1 spray in each nostril twice a day as needed. Use the "crossover" technique into opposite nostril spraying toward opposite ear @ 45 degree angle, not straight up into nostril.  Use a Neti pot daily only  as needed for significant sinus congestion; going from open side to congested side . Plain Allegra (NOT D )  160 daily , Loratidine 10 mg , OR Zyrtec 10 mg @ bedtime  as needed for itchy eyes & sneezing.

## 2013-11-10 ENCOUNTER — Ambulatory Visit: Payer: Managed Care, Other (non HMO) | Admitting: Internal Medicine

## 2013-11-10 NOTE — Telephone Encounter (Signed)
Spoke with the pt and informed her of Dr. Frederik Pear recommendation below.  Pt understood and agreed.  Pt stated that she feels this should help and she want have to come in.//AB/CMA

## 2014-01-31 ENCOUNTER — Telehealth: Payer: Self-pay

## 2014-01-31 NOTE — Telephone Encounter (Signed)
The patient called hoping to remove her no show charge. She states she believes this was mix up.  She is hoping to have this charge taken off her account.    Callback [phone]  (Let me know if I need to send this to send someone else)

## 2014-02-05 NOTE — Telephone Encounter (Signed)
Patient called again checking to see if the no show charge had been resolved. Please advise

## 2014-02-09 ENCOUNTER — Other Ambulatory Visit: Payer: Self-pay

## 2014-02-09 DIAGNOSIS — Z1231 Encounter for screening mammogram for malignant neoplasm of breast: Secondary | ICD-10-CM

## 2014-02-09 NOTE — Telephone Encounter (Signed)
Will send message to charge correction for fee removal. These type of billing requests should not be handled in patient's chart. Thx. Closing encounter.

## 2014-03-01 ENCOUNTER — Ambulatory Visit: Payer: Managed Care, Other (non HMO)

## 2014-03-01 ENCOUNTER — Ambulatory Visit
Admission: RE | Admit: 2014-03-01 | Discharge: 2014-03-01 | Disposition: A | Discharge status: Home / Self Care | Payer: BC Managed Care – PPO | Source: Ambulatory Visit

## 2014-03-01 ENCOUNTER — Ambulatory Visit (INDEPENDENT_AMBULATORY_CARE_PROVIDER_SITE_OTHER): Payer: BC Managed Care – PPO | Admitting: Internal Medicine

## 2014-03-01 ENCOUNTER — Other Ambulatory Visit (INDEPENDENT_AMBULATORY_CARE_PROVIDER_SITE_OTHER): Payer: BC Managed Care – PPO

## 2014-03-01 ENCOUNTER — Encounter: Payer: Self-pay | Admitting: Internal Medicine

## 2014-03-01 VITALS — BP 150/80 | HR 77 | Temp 98.7°F | Resp 12 | Ht 66.25 in | Wt 188.8 lb

## 2014-03-01 DIAGNOSIS — Z0000 Encounter for general adult medical examination without abnormal findings: Secondary | ICD-10-CM

## 2014-03-01 DIAGNOSIS — R232 Flushing: Secondary | ICD-10-CM

## 2014-03-01 DIAGNOSIS — N951 Menopausal and female climacteric states: Secondary | ICD-10-CM

## 2014-03-01 DIAGNOSIS — M949 Disorder of cartilage, unspecified: Secondary | ICD-10-CM

## 2014-03-01 DIAGNOSIS — E782 Mixed hyperlipidemia: Secondary | ICD-10-CM

## 2014-03-01 DIAGNOSIS — M899 Disorder of bone, unspecified: Secondary | ICD-10-CM

## 2014-03-01 DIAGNOSIS — Z1231 Encounter for screening mammogram for malignant neoplasm of breast: Secondary | ICD-10-CM

## 2014-03-01 LAB — CBC WITH DIFFERENTIAL/PLATELET
BASOS PCT: 1.1 % (ref 0.0–3.0)
Basophils Absolute: 0.1 10*3/uL (ref 0.0–0.1)
EOS ABS: 0.3 10*3/uL (ref 0.0–0.7)
EOS PCT: 3.4 % (ref 0.0–5.0)
HEMATOCRIT: 42.9 % (ref 36.0–46.0)
Hemoglobin: 14.6 g/dL (ref 12.0–15.0)
LYMPHS ABS: 2.7 10*3/uL (ref 0.7–4.0)
Lymphocytes Relative: 29.6 % (ref 12.0–46.0)
MCHC: 34 g/dL (ref 30.0–36.0)
MCV: 89.7 fl (ref 78.0–100.0)
MONO ABS: 0.7 10*3/uL (ref 0.1–1.0)
Monocytes Relative: 7.6 % (ref 3.0–12.0)
NEUTROS PCT: 58.3 % (ref 43.0–77.0)
Neutro Abs: 5.3 10*3/uL (ref 1.4–7.7)
PLATELETS: 278 10*3/uL (ref 150.0–400.0)
RBC: 4.78 Mil/uL (ref 3.87–5.11)
RDW: 12.5 % (ref 11.5–14.6)
WBC: 9.2 10*3/uL (ref 4.5–10.5)

## 2014-03-01 LAB — LIPID PANEL
CHOLESTEROL: 204 mg/dL — AB (ref 0–200)
HDL: 48.1 mg/dL (ref >39.00)
LDL Cholesterol: 138 mg/dL — ABNORMAL HIGH (ref 0–99)
TRIGLYCERIDES: 91 mg/dL (ref 0.0–149.0)
Total CHOL/HDL Ratio: 4
VLDL: 18.2 mg/dL (ref 0.0–40.0)

## 2014-03-01 LAB — BASIC METABOLIC PANEL
BUN: 14 mg/dL (ref 6–23)
CHLORIDE: 104 meq/L (ref 96–112)
CO2: 31 meq/L (ref 19–32)
CREATININE: 0.8 mg/dL (ref 0.4–1.2)
Calcium: 9.7 mg/dL (ref 8.4–10.5)
GFR: 99.97 mL/min (ref >60.00)
Glucose, Bld: 89 mg/dL (ref 70–99)
POTASSIUM: 4.3 meq/L (ref 3.5–5.1)
Sodium: 140 mEq/L (ref 135–145)

## 2014-03-01 LAB — HEPATIC FUNCTION PANEL
ALT: 20 U/L (ref 0–35)
AST: 18 U/L (ref 0–37)
Albumin: 4 g/dL (ref 3.5–5.2)
Alkaline Phosphatase: 45 U/L (ref 39–117)
BILIRUBIN TOTAL: 0.8 mg/dL (ref 0.3–1.2)
Bilirubin, Direct: 0.1 mg/dL (ref 0.0–0.3)
TOTAL PROTEIN: 7.4 g/dL (ref 6.0–8.3)

## 2014-03-01 LAB — TSH: TSH: 0.99 u[IU]/mL (ref 0.35–5.50)

## 2014-03-01 MED ORDER — CLONIDINE HCL 0.1 MG PO TABS: ORAL_TABLET | ORAL | Status: DC

## 2014-03-01 NOTE — Progress Notes (Signed)
   Subjective:    Patient ID: Sheila Solomon, female    DOB: Sep 05, 1956, 58 y.o.   MRN: [id number]  HPI  She is here for a physical;acute issues denied except night sweats.    Review of Systems A heart healthy diet is followed;minimal  exercise due to family obligations.  Family history is neg for premature coronary disease. Advanced cholesterol testing reveals  LDL goal is less than 125 ; ideally < 95 . There is medication  Non compliance with the statin; never started.  Low dose ASA not  taken Specifically denied are  chest pain, palpitations, dyspnea, or claudication.  Significant abdominal symptoms, memory deficit, or myalgias not present. No BP monitor.     Objective:   Physical Exam Gen.: Healthy and well-nourished in appearance. Alert, appropriate and cooperative throughout exam. Appears younger than stated age  Head: Normocephalic without obvious abnormalities  Eyes: No corneal or conjunctival inflammation noted. Pupils equal round reactive to light and accommodation. Extraocular motion intact.  Ears: External  ear exam reveals no significant lesions or deformities. Canals clear .TMs normal. Hearing is grossly normal bilaterally. Nose: External nasal exam reveals no deformity or inflammation. Nasal mucosa are pink and moist. No lesions or exudates noted.   Mouth: Oral mucosa and oropharynx reveal no lesions or exudates. Teeth in good repair. Neck: No deformities, masses, or tenderness noted. Range of motion &. Thyroid normal Lungs: Normal respiratory effort; chest expands symmetrically. Lungs are clear to auscultation without rales, wheezes, or increased work of breathing. Heart: Normal rate and rhythm. Normal S1 and S2. No gallop, click, or rub. Grade 1/2 systolic murmur. Abdomen: Bowel sounds normal; abdomen soft and nontender. No masses, organomegaly or hernias noted. Genitalia:as per Gyn                                  Musculoskeletal/extremities: No deformity or scoliosis  noted of  the thoracic or lumbar spine.   No clubbing, cyanosis, edema, or significant extremity  deformity noted. Range of motion normal .Tone & strength normal. Hand joints normal. Crepitus L knee  Fingernail  health good. Able to lie down & sit up w/o help. Negative SLR bilaterally Vascular: Carotid, radial artery, dorsalis pedis and  posterior tibial pulses are full and equal. No bruits present. Neurologic: Alert and oriented x3. Deep tendon reflexes symmetrical and normal.  Gait norma. Skin: Intact without suspicious lesions or rashes. Lymph: No cervical, axillary lymphadenopathy present. Psych: Mood and affect are normal. Normally interactive                                                                                    Assessment & Plan:   #1 comprehensive physical exam; no acute findings #2 hot flashes  Plan: see Orders  & Recommendations

## 2014-03-01 NOTE — Patient Instructions (Signed)

## 2014-03-01 NOTE — Progress Notes (Signed)
Pre visit review using our clinic review tool, if applicable. No additional management support is needed unless otherwise documented below in the visit note. 

## 2014-03-05 LAB — VITAMIN D 1,25 DIHYDROXY
Vitamin D 1, 25 (OH)2 Total: 72 pg/mL (ref 18–72)
Vitamin D3 1, 25 (OH)2: 72 pg/mL

## 2014-03-22 ENCOUNTER — Other Ambulatory Visit: Payer: BC Managed Care – PPO

## 2014-03-28 ENCOUNTER — Ambulatory Visit (INDEPENDENT_AMBULATORY_CARE_PROVIDER_SITE_OTHER)
Admission: RE | Admit: 2014-03-28 | Discharge: 2014-03-28 | Disposition: A | Discharge status: Home / Self Care | Payer: BC Managed Care – PPO | Source: Ambulatory Visit | Attending: Internal Medicine | Admitting: Internal Medicine

## 2014-03-28 DIAGNOSIS — M899 Disorder of bone, unspecified: Secondary | ICD-10-CM

## 2014-03-28 DIAGNOSIS — M949 Disorder of cartilage, unspecified: Principal | ICD-10-CM

## 2014-04-21 ENCOUNTER — Encounter: Payer: Self-pay | Admitting: Family Medicine

## 2014-04-21 ENCOUNTER — Ambulatory Visit (INDEPENDENT_AMBULATORY_CARE_PROVIDER_SITE_OTHER): Payer: BC Managed Care – PPO | Admitting: Family Medicine

## 2014-04-21 VITALS — BP 110/84 | Temp 98.3°F | Wt 190.0 lb

## 2014-04-21 DIAGNOSIS — S0086XA Insect bite (nonvenomous) of other part of head, initial encounter: Principal | ICD-10-CM

## 2014-04-21 DIAGNOSIS — L089 Local infection of the skin and subcutaneous tissue, unspecified: Secondary | ICD-10-CM | POA: Insufficient documentation

## 2014-04-21 DIAGNOSIS — S60469A Insect bite (nonvenomous) of unspecified finger, initial encounter: Secondary | ICD-10-CM

## 2014-04-21 DIAGNOSIS — IMO0001 Reserved for inherently not codable concepts without codable children: Secondary | ICD-10-CM

## 2014-04-21 DIAGNOSIS — W57XXXA Bitten or stung by nonvenomous insect and other nonvenomous arthropods, initial encounter: Secondary | ICD-10-CM | POA: Insufficient documentation

## 2014-04-21 NOTE — Progress Notes (Signed)
   Subjective:    Patient ID: Sheila Solomon, female    DOB: Oct 17, 1956, 58 y.o.   MRN: [id number]  HPI Sheila Solomon  is a 58 year old female who comes in today for evaluation of a bug bite  This morning she noticed 2 small bites in her right axillary area. She thinks it may have been a spider but she did not see the insect.  She feels well no fever chills etc.  She said she's been watching TV and heard about bug bites because larva infestation in your body however she's not had any foreign travel   Review of Systems    negative Objective:   Physical Exam Well-developed well-nourished female no acute distress vital signs stable she is afebrile examination right axillary cyst 2 small bug bites and no secondary infection       Assessment & Plan:  Bug bites right axillary a probably small spider bites. Advise warm soaks , antibiotic ointment and a Band-Aid twice daily return when necessary

## 2014-04-21 NOTE — Patient Instructions (Signed)
Scrub twice daily with soap and water apply antibiotic ointment and a Band-Aid it  Call when necessary

## 2014-04-21 NOTE — Progress Notes (Signed)
Pre visit review using our clinic review tool, if applicable. No additional management support is needed unless otherwise documented below in the visit note. 

## 2014-09-07 ENCOUNTER — Other Ambulatory Visit: Payer: Self-pay

## 2014-10-09 ENCOUNTER — Telehealth: Payer: Self-pay | Admitting: *Deleted

## 2014-10-09 NOTE — Telephone Encounter (Signed)
It still should be potent if kept capped

## 2014-10-09 NOTE — Telephone Encounter (Signed)
Pt stated md rx Ichthalmol oint 20% in the past for a rash that she had. She has develop another rash on her buttock area wanting to know if she can use what she have at home its been over a year, if not can md send refill into her pharmacy...Johny Chess

## 2014-10-10 NOTE — Telephone Encounter (Signed)
Notified pt with md response.../lmb 

## 2015-04-03 ENCOUNTER — Other Ambulatory Visit: Payer: Self-pay

## 2015-04-03 DIAGNOSIS — Z1231 Encounter for screening mammogram for malignant neoplasm of breast: Secondary | ICD-10-CM

## 2015-04-04 ENCOUNTER — Ambulatory Visit: Payer: Self-pay

## 2015-04-18 ENCOUNTER — Encounter: Payer: Self-pay | Admitting: Gastroenterology

## 2015-04-24 IMAGING — MG MM SCREEN MAMMOGRAM BILATERAL
4 series · 4 of 4 positions shown · non-contrast
Comparison: Previous exam(s).

CLINICAL DATA: Screening.

EXAM:
DIGITAL SCREENING BILATERAL MAMMOGRAM WITH CAD

[R CC]
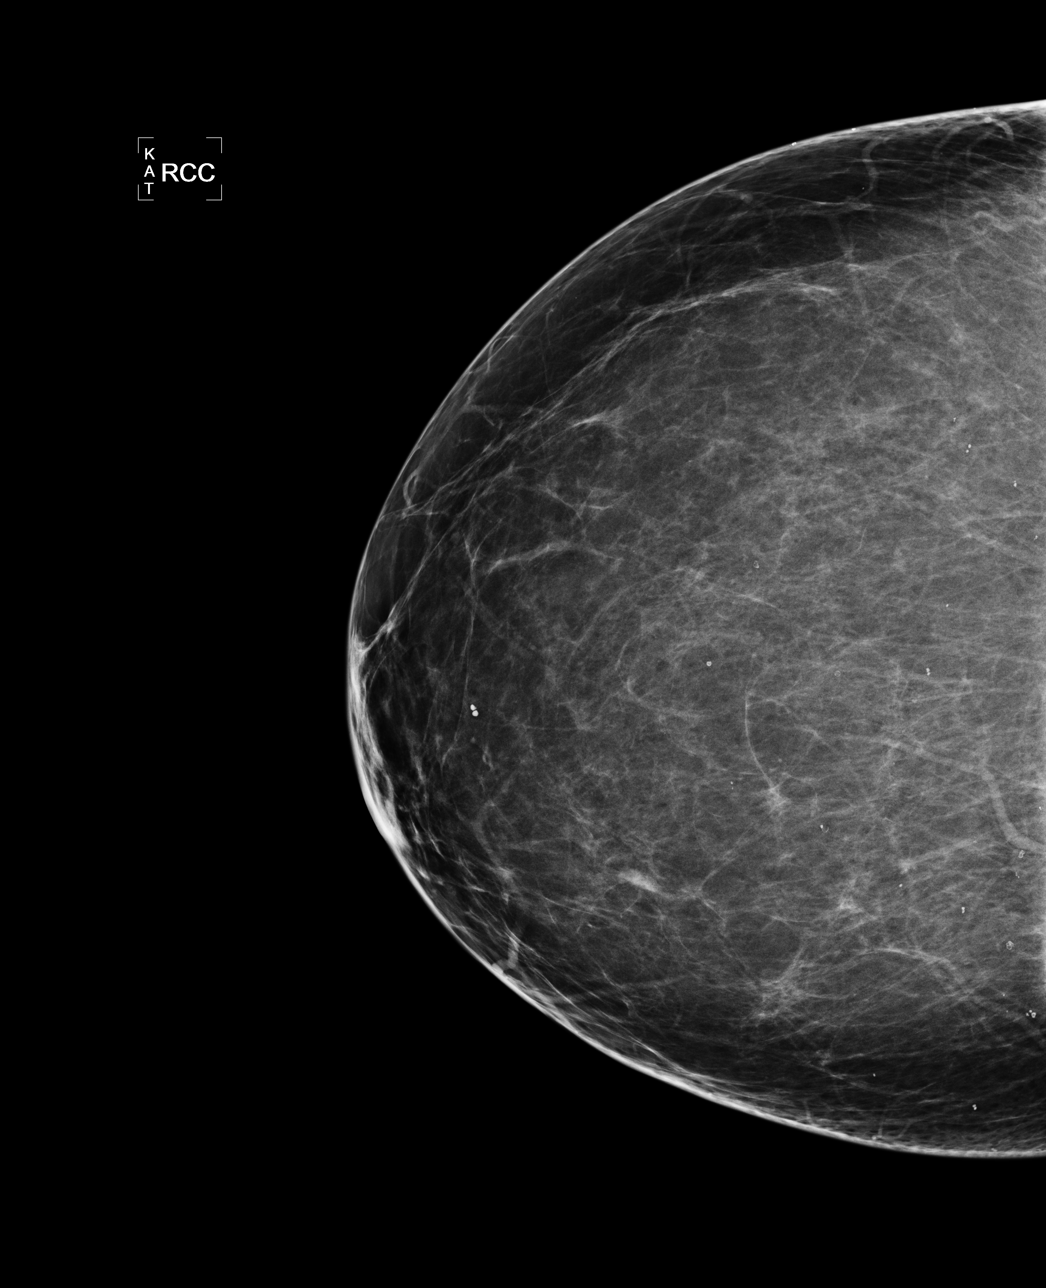

[L CC]
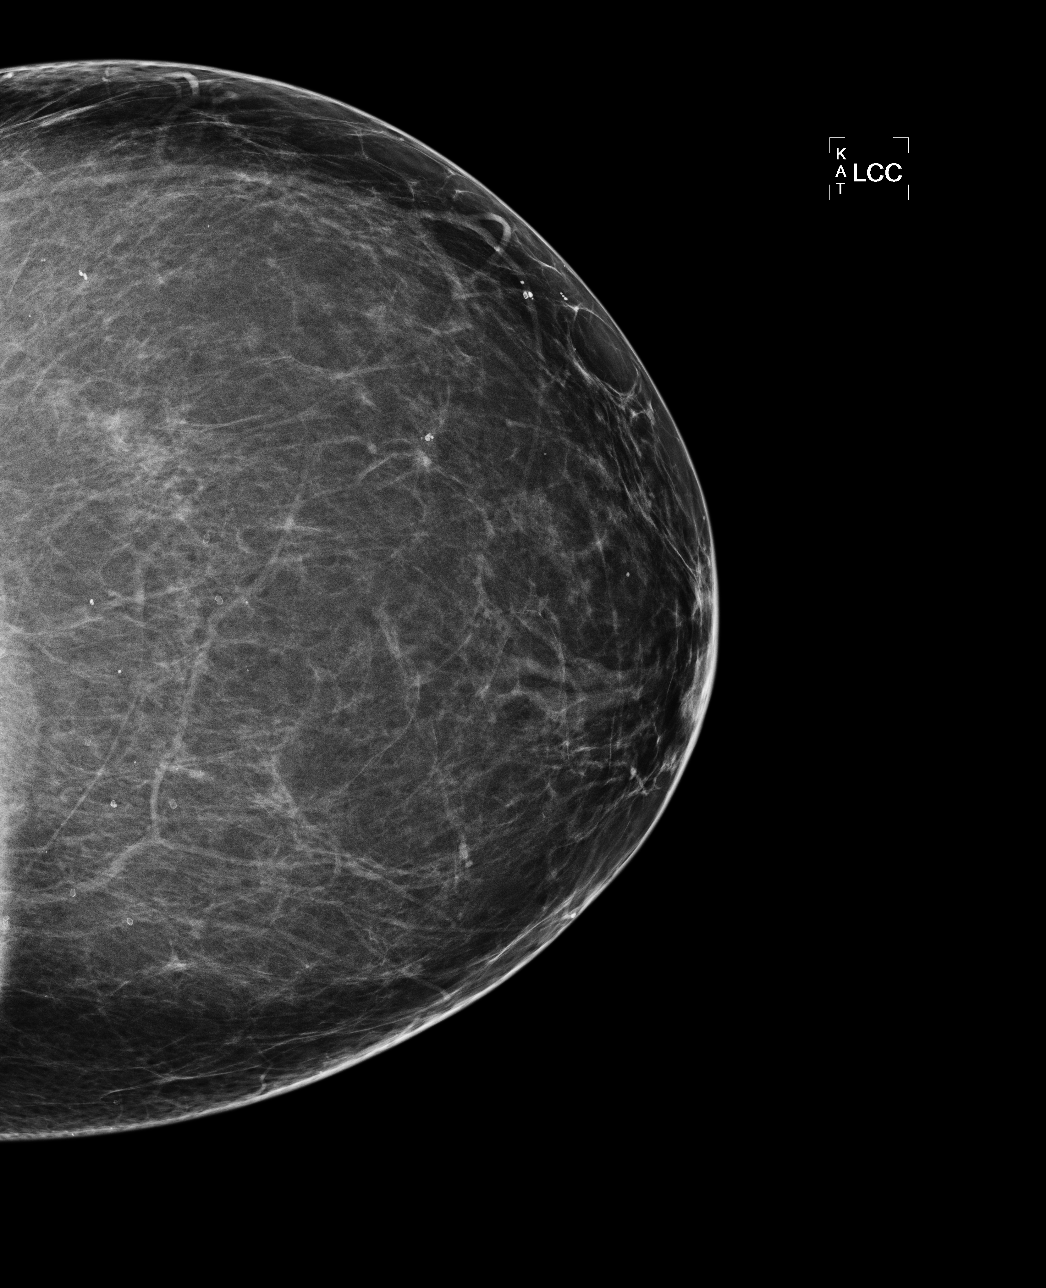

[L MLO]
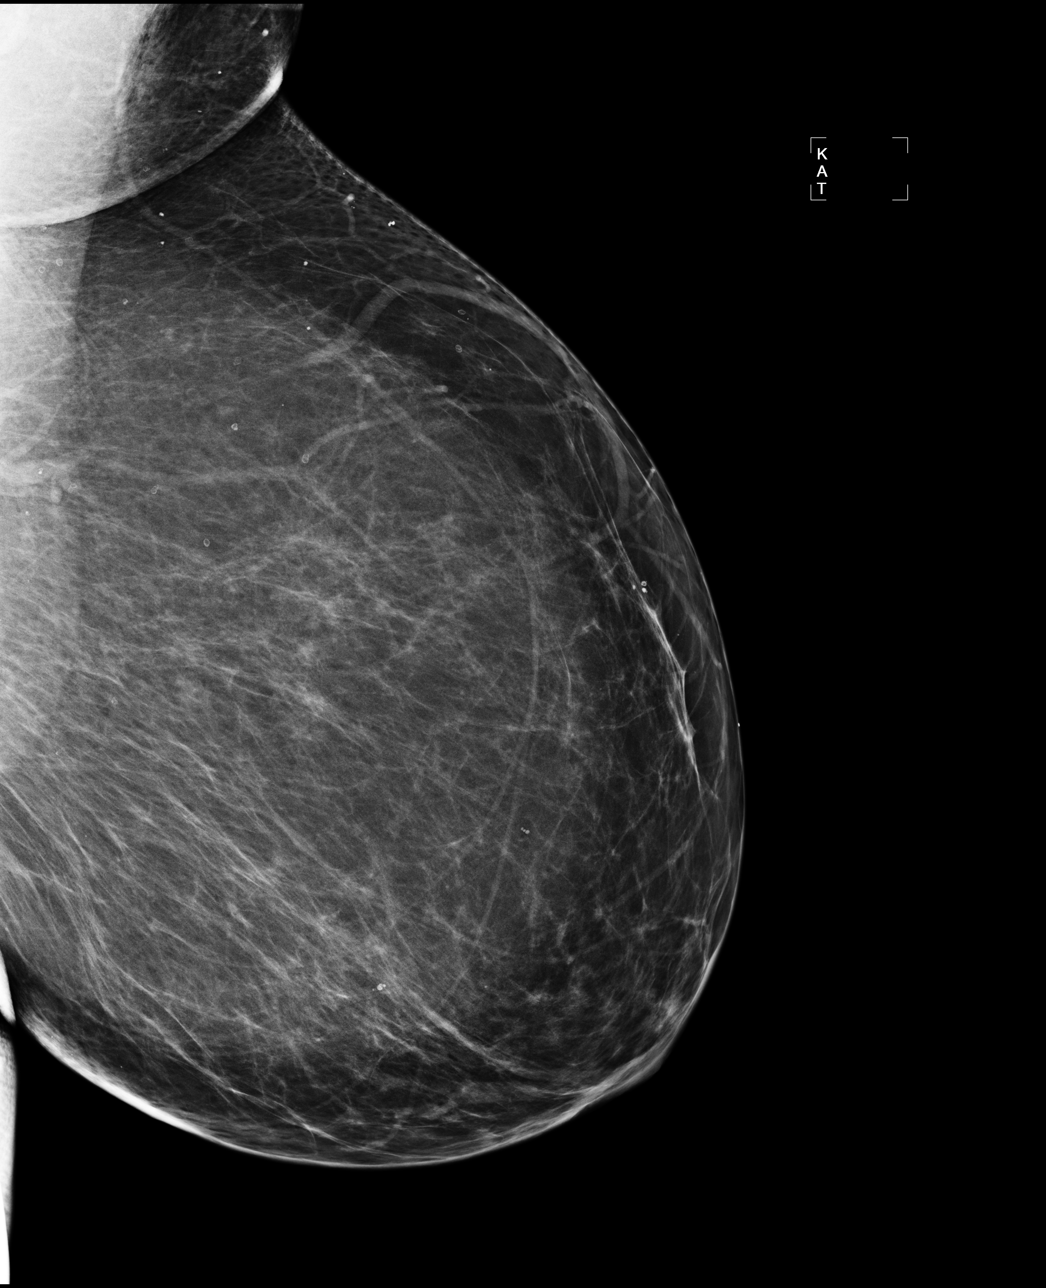

[R MLO]
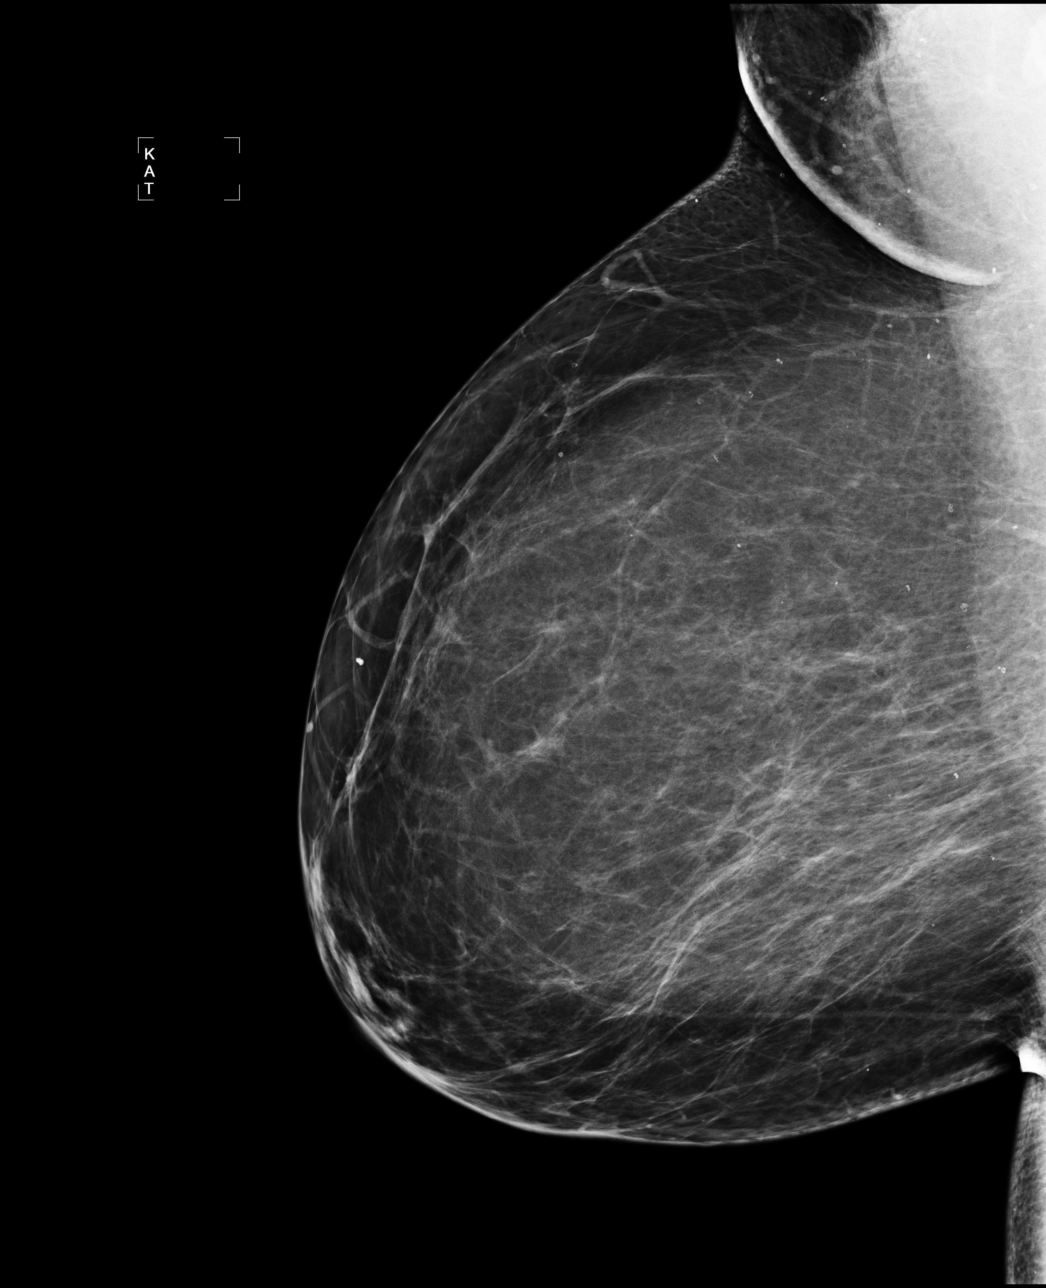

[4 of 4 positions shown; findings below may reference images not displayed]

ACR Breast Density Category b: There are scattered areas of
fibroglandular density.
FINDINGS: There are no findings suspicious for malignancy. Images were
processed with CAD.
IMPRESSION: No mammographic evidence of malignancy. A result letter of this
screening mammogram will be mailed directly to the patient.

RECOMMENDATION:
Screening mammogram in one year. (Code:AS-G-LCT)

BI-RADS CATEGORY  1: Negative.

## 2015-11-14 ENCOUNTER — Other Ambulatory Visit (INDEPENDENT_AMBULATORY_CARE_PROVIDER_SITE_OTHER): Payer: BLUE CROSS/BLUE SHIELD

## 2015-11-14 ENCOUNTER — Ambulatory Visit (INDEPENDENT_AMBULATORY_CARE_PROVIDER_SITE_OTHER): Payer: BLUE CROSS/BLUE SHIELD | Admitting: Internal Medicine

## 2015-11-14 ENCOUNTER — Encounter: Payer: Self-pay | Admitting: Internal Medicine

## 2015-11-14 ENCOUNTER — Telehealth: Payer: Self-pay | Admitting: Emergency Medicine

## 2015-11-14 VITALS — BP 142/98 | HR 78 | Temp 98.8°F | Resp 12 | Ht 65.5 in | Wt 196.0 lb

## 2015-11-14 DIAGNOSIS — Z0000 Encounter for general adult medical examination without abnormal findings: Secondary | ICD-10-CM | POA: Diagnosis not present

## 2015-11-14 LAB — BASIC METABOLIC PANEL
BUN: 14 mg/dL (ref 6–23)
CHLORIDE: 102 meq/L (ref 96–112)
CO2: 31 meq/L (ref 19–32)
Calcium: 9.7 mg/dL (ref 8.4–10.5)
Creatinine, Ser: 0.79 mg/dL (ref 0.40–1.20)
GFR: 95.06 mL/min (ref >60.00)
GLUCOSE: 90 mg/dL (ref 70–99)
POTASSIUM: 3.7 meq/L (ref 3.5–5.1)
Sodium: 139 mEq/L (ref 135–145)

## 2015-11-14 LAB — CBC WITH DIFFERENTIAL/PLATELET
BASOS PCT: 0.7 % (ref 0.0–3.0)
Basophils Absolute: 0.1 10*3/uL (ref 0.0–0.1)
EOS PCT: 2.9 % (ref 0.0–5.0)
Eosinophils Absolute: 0.3 10*3/uL (ref 0.0–0.7)
HCT: 44.4 % (ref 36.0–46.0)
Hemoglobin: 14.6 g/dL (ref 12.0–15.0)
LYMPHS ABS: 3 10*3/uL (ref 0.7–4.0)
Lymphocytes Relative: 31.9 % (ref 12.0–46.0)
MCHC: 32.9 g/dL (ref 30.0–36.0)
MCV: 90.4 fl (ref 78.0–100.0)
MONO ABS: 0.8 10*3/uL (ref 0.1–1.0)
MONOS PCT: 7.9 % (ref 3.0–12.0)
NEUTROS PCT: 56.6 % (ref 43.0–77.0)
Neutro Abs: 5.4 10*3/uL (ref 1.4–7.7)
Platelets: 309 10*3/uL (ref 150.0–400.0)
RBC: 4.91 Mil/uL (ref 3.87–5.11)
RDW: 12.7 % (ref 11.5–15.5)
WBC: 9.5 10*3/uL (ref 4.0–10.5)

## 2015-11-14 LAB — HEPATIC FUNCTION PANEL
ALBUMIN: 4.2 g/dL (ref 3.5–5.2)
ALT: 18 U/L (ref 0–35)
AST: 15 U/L (ref 0–37)
Alkaline Phosphatase: 52 U/L (ref 39–117)
BILIRUBIN TOTAL: 0.6 mg/dL (ref 0.2–1.2)
Bilirubin, Direct: 0.1 mg/dL (ref 0.0–0.3)
Total Protein: 7.7 g/dL (ref 6.0–8.3)

## 2015-11-14 LAB — LIPID PANEL
CHOLESTEROL: 225 mg/dL — AB (ref 0–200)
HDL: 46.9 mg/dL (ref >39.00)
LDL CALC: 156 mg/dL — AB (ref 0–99)
NonHDL: 177.84
TRIGLYCERIDES: 109 mg/dL (ref 0.0–149.0)
Total CHOL/HDL Ratio: 5
VLDL: 21.8 mg/dL (ref 0.0–40.0)

## 2015-11-14 LAB — TSH: TSH: 1.33 u[IU]/mL (ref 0.35–4.50)

## 2015-11-14 NOTE — Progress Notes (Signed)
   Subjective:    Patient ID: Sheila Solomon, female    DOB: 20-Sep-1956, 59 y.o.   MRN: [id number]  HPI The patient is here for a physical to assess status of active health conditions.  PMH, FH, & Social History reviewed & updated.No change in [place] as recorded.   She is on a heart healthy diet. She is not exercising regularly. She denies any active cardio pulmonary symptoms. She never filled the prescription for clonidine. She states that her blood pressure at home averages less than 125 over the 70s. She's never smoked and does not drink alcohol.  Colonoscopy is up-to-date and is due 2019. She has no active GI symptoms.   She has had some increased anxiety as she quit her job in 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 She denies significant anxiety, depression, anorexia, sleep disturbance, or panic attacks.  Review of Systems  Chest pain, palpitations, tachycardia, exertional dyspnea, paroxysmal nocturnal dyspnea, claudication or edema are absent. No unexplained weight loss, abdominal pain, significant dyspepsia, dysphagia, melena, rectal bleeding, or persistently small caliber stools. Dysuria, pyuria, hematuria, frequency, nocturia or polyuria are denied. Change in hair, skin, nails denied. No bowel changes of constipation or diarrhea. No intolerance to heat or cold.    Objective:   Physical Exam  Pertinent or positive findings include:Minor knee crepitus present.  General appearance :adequately nourished; in no distress.  Eyes: No conjunctival inflammation or scleral icterus is present.  Oral exam:  Lips and gums are healthy appearing.There is no oropharyngeal erythema or exudate noted. Dental hygiene is good.  Heart:  Normal rate and regular rhythm. S1 and S2 normal without gallop, murmur, click, rub or other extra sounds    Lungs:Chest clear to auscultation; no wheezes, rhonchi,rales ,or rubs present.No increased work of breathing.    Abdomen: bowel sounds normal, soft and non-tender without masses, organomegaly or hernias noted.  No guarding or rebound.   Vascular : all pulses equal ; no bruits present.  Skin:Warm & dry.  Intact without suspicious lesions or rashes ; no tenting or jaundice   Lymphatic: No lymphadenopathy is noted about the head, neck, axilla.   Neuro: Strength, tone & DTRs normal.     Assessment & Plan:  #1 comprehensive physical exam; no acute findings  Plan: see Orders  & Recommendations

## 2015-11-14 NOTE — Telephone Encounter (Signed)
Pt came in for labs and is requesting Hep C testing

## 2015-11-14 NOTE — Progress Notes (Signed)
Pre visit review using our clinic review tool, if applicable. No additional management support is needed unless otherwise documented below in the visit note. 

## 2015-11-14 NOTE — Patient Instructions (Signed)
Minimal Blood Pressure Goal= AVERAGE < 140/90;  Ideal is an AVERAGE < 135/85. This AVERAGE should be calculated from @ least 5-7 BP readings taken @ different times of day on different days of week. You should not respond to isolated BP readings , but rather the AVERAGE for that week .Please bring your  blood pressure cuff to office visits to verify that it is reliable.It  can also be checked against the blood pressure device at the pharmacy. Finger or wrist cuffs are not dependable; an arm cuff is. Your next office appointment will be determined based upon review of your pending labs. Those written interpretation of the lab results and instructions will be transmitted to you by mail for your records.  Critical results will be called.   Followup as needed for any active or acute issue. Please report any significant change in your symptoms. 

## 2015-11-15 ENCOUNTER — Encounter: Payer: Self-pay | Admitting: Internal Medicine

## 2015-11-15 LAB — HEPATITIS C ANTIBODY: HCV Ab: NEGATIVE

## 2016-06-30 LAB — HM PAP SMEAR

## 2016-07-21 ENCOUNTER — Encounter: Payer: Self-pay | Admitting: Internal Medicine

## 2016-11-05 ENCOUNTER — Ambulatory Visit (INDEPENDENT_AMBULATORY_CARE_PROVIDER_SITE_OTHER): Payer: BLUE CROSS/BLUE SHIELD | Admitting: Internal Medicine

## 2016-11-05 ENCOUNTER — Encounter: Payer: Self-pay | Admitting: Internal Medicine

## 2016-11-05 VITALS — BP 122/86 | HR 89 | Temp 98.6°F | Resp 16 | Wt 190.0 lb

## 2016-11-05 DIAGNOSIS — J069 Acute upper respiratory infection, unspecified: Secondary | ICD-10-CM | POA: Diagnosis not present

## 2016-11-05 DIAGNOSIS — B9789 Other viral agents as the cause of diseases classified elsewhere: Secondary | ICD-10-CM | POA: Diagnosis not present

## 2016-11-05 NOTE — Progress Notes (Signed)
Subjective:    Patient ID: Sheila Solomon, female    DOB: 12-Sep-1956, 60 y.o.   MRN: [id number]  HPI She is here for an acute visit for cold symptoms.   Her symptoms started 3 days ago.  Her symptoms were instant onset and started in the chest.  She has had a little sore throat.  She feels a little weak.  She has nasal congestion, cough, headaches, muscle aches and diarrhea yesterday.   She denies fever, SOB and wheeze.    She has tried taking mucinex, ibuprofen. She did warm salt gargles.  She is drinking lots of fluids and resting.  She did work out yesterday.     Medications and allergies reviewed with patient and updated if appropriate.  Patient Active Problem List   Diagnosis Date Noted  . Bug bite of face with infection 04/21/2014  . VITAMIN D DEFICIENCY 08/14/2010  . OSTEOPENIA 11/12/2008  . ELEVATED BLOOD PRESSURE WITHOUT DIAGNOSIS OF HYPERTENSION 11/12/2008  . HYPERLIPIDEMIA 11/14/2007    Current Outpatient Prescriptions on File Prior to Visit  Medication Sig Dispense Refill  . Biotin 1000 MCG tablet Take 1,000 mcg by mouth as needed.    . calcium carbonate (OS-CAL) 600 MG TABS tablet Take 600 mg by mouth 2 (two) times daily with a meal.    . Cholecalciferol (VITAMIN D3) 2000 UNITS TABS Take 1 tablet by mouth daily.    Marland Kitchen Cod Liver Oil CAPS Take 1 capsule by mouth daily.    . ferrous sulfate 325 (65 FE) MG tablet Take 325 mg by mouth as needed.    . Multiple Vitamin (MULTIVITAMIN) tablet Take 1 tablet by mouth daily.    . vitamin C (ASCORBIC ACID) 500 MG tablet Take 500 mg by mouth as needed.     No current facility-administered medications on file prior to visit.     Past Medical History:  Diagnosis Date  . Hyperlipidemia   . Osteopenia    [facility]  . Vitamin D deficiency     Past Surgical History:  Procedure Laterality Date  . COLONOSCOPY  2012    [facility];negative; due 2022  . G 1 P 1     C section  . TOTAL ABDOMINAL HYSTERECTOMY     fibroids & dysfunctional menses    Social History   Social History  . Marital status: Divorced    Spouse name: N/A  . Number of children: N/A  . Years of education: N/A   Social History Main Topics  . Smoking status: Never Smoker  . Smokeless tobacco: Not on file  . Alcohol use No     Comment:  rare wine  . Drug use: No  . Sexual activity: Not on file   Other Topics Concern  . Not on file   Social History Narrative  . No narrative on file    Family History  Problem Relation Age of Onset  . Hypertension Mother   . Coronary artery disease Mother     4 v CBAG in 18s  . Hypertension Father   . Diabetes Father   . Other Father     elevated PSA  . Hypertension Sister   . Hypertension Brother   . Stroke Paternal Grandmother     in 40s    Review of Systems  Constitutional: Positive for fatigue. Negative for fever.  HENT: Positive for congestion, postnasal drip and sore throat (resolved). Negative for ear pain, sinus pain and sinus pressure.  Respiratory: Positive for cough (mild). Negative for shortness of breath and wheezing.   Cardiovascular: Negative for chest pain.  Gastrointestinal: Positive for diarrhea (yesterday).  Musculoskeletal: Positive for myalgias.  Neurological: Positive for headaches. Negative for dizziness and light-headedness.       Objective:   Vitals:   11/05/16 1045  BP: 122/86  Pulse: 89  Resp: 16  Temp: 98.6 F (37 C)   Filed Weights   11/05/16 1045  Weight: 190 lb (86.2 kg)   Body mass index is 31.14 kg/m.   Physical Exam GENERAL APPEARANCE: Appears stated age, well appearing, NAD EYES: conjunctiva clear, no icterus HEENT: bilateral tympanic membranes and ear canals normal, oropharynx with no erythema, no thyromegaly, trachea midline, no cervical or supraclavicular lymphadenopathy LUNGS: Clear to auscultation without wheeze or crackles, unlabored breathing, good air entry bilaterally HEART: Normal S1,S2 without  murmurs EXTREMITIES: Without clubbing, cyanosis, or edema        Assessment & Plan:   See Problem List for Assessment and Plan of chronic medical problems.

## 2016-11-05 NOTE — Patient Instructions (Addendum)
You have a viral illness.    If your symptoms worsen or fail to improve, please contact our office for further instruction, or in case of emergency go directly to the emergency room at the closest medical facility.   General Recommendations:    Please drink plenty of fluids.  Get plenty of rest   Sleep in humidified air  Use saline nasal sprays  Netti pot  OTC Medications:  Decongestants - helps relieve congestion   Flonase (generic fluticasone) or Nasacort (generic triamcinolone) - please make sure to use the "cross-over" technique at a 45 degree angle towards the opposite eye as opposed to straight up the nasal passageway.   Sudafed (generic pseudoephedrine - Note this is the one that is available behind the pharmacy counter); Products with phenylephrine (-PE) may also be used but is often not as effective as pseudoephedrine.   If you have HIGH BLOOD PRESSURE - Coricidin HBP; AVOID any product that is -D as this contains pseudoephedrine which may increase your blood pressure.  Afrin (oxymetazoline) every 6-8 hours for up to 3 days.  Allergies - helps relieve runny nose, itchy eyes and sneezing   Claritin (generic loratidine), Allegra (fexofenidine), or Zyrtec (generic cyrterizine) for runny nose. These medications should not cause drowsiness.  Note - Benadryl (generic diphenhydramine) may be used however may cause drowsiness  Cough -   Delsym or Robitussin (generic dextromethorphan)  Expectorants - helps loosen mucus to ease removal   Mucinex (generic guaifenesin) as directed on the package.  Headaches / General Aches   Tylenol (generic acetaminophen) - DO NOT EXCEED 3 grams (3,000 mg) in a 24 hour time period  Advil/Motrin (generic ibuprofen)  Sore Throat -   Salt water gargle   Chloraseptic (generic benzocaine) spray or lozenges / Sucrets (generic dyclonine)

## 2016-11-05 NOTE — Assessment & Plan Note (Signed)
Likely viral in nature Continue symptomatic treatment - continue mucinex Rest, fluids Call if no improvement

## 2016-11-05 NOTE — Progress Notes (Signed)
Pre visit review using our clinic review tool, if applicable. No additional management support is needed unless otherwise documented below in the visit note. 

## 2016-12-02 ENCOUNTER — Encounter: Payer: Self-pay | Admitting: Internal Medicine

## 2016-12-02 ENCOUNTER — Ambulatory Visit (INDEPENDENT_AMBULATORY_CARE_PROVIDER_SITE_OTHER): Payer: BLUE CROSS/BLUE SHIELD | Admitting: Internal Medicine

## 2016-12-02 ENCOUNTER — Other Ambulatory Visit (INDEPENDENT_AMBULATORY_CARE_PROVIDER_SITE_OTHER): Payer: BLUE CROSS/BLUE SHIELD

## 2016-12-02 VITALS — BP 134/86 | HR 80 | Temp 98.7°F | Resp 16 | Ht 66.0 in | Wt 193.0 lb

## 2016-12-02 DIAGNOSIS — Z114 Encounter for screening for human immunodeficiency virus [HIV]: Secondary | ICD-10-CM | POA: Diagnosis not present

## 2016-12-02 DIAGNOSIS — R030 Elevated blood-pressure reading, without diagnosis of hypertension: Secondary | ICD-10-CM

## 2016-12-02 DIAGNOSIS — Z0000 Encounter for general adult medical examination without abnormal findings: Secondary | ICD-10-CM

## 2016-12-02 DIAGNOSIS — Z13820 Encounter for screening for osteoporosis: Secondary | ICD-10-CM

## 2016-12-02 DIAGNOSIS — M85861 Other specified disorders of bone density and structure, right lower leg: Secondary | ICD-10-CM

## 2016-12-02 LAB — CBC WITH DIFFERENTIAL/PLATELET
BASOS PCT: 1 % (ref 0.0–3.0)
Basophils Absolute: 0.1 10*3/uL (ref 0.0–0.1)
Eosinophils Absolute: 0.5 10*3/uL (ref 0.0–0.7)
Eosinophils Relative: 5.6 % — ABNORMAL HIGH (ref 0.0–5.0)
HCT: 40.7 % (ref 36.0–46.0)
HEMOGLOBIN: 13.9 g/dL (ref 12.0–15.0)
Lymphocytes Relative: 27.6 % (ref 12.0–46.0)
Lymphs Abs: 2.2 10*3/uL (ref 0.7–4.0)
MCHC: 34.2 g/dL (ref 30.0–36.0)
MCV: 89.5 fl (ref 78.0–100.0)
MONOS PCT: 7.8 % (ref 3.0–12.0)
Monocytes Absolute: 0.6 10*3/uL (ref 0.1–1.0)
Neutro Abs: 4.7 10*3/uL (ref 1.4–7.7)
Neutrophils Relative %: 58 % (ref 43.0–77.0)
Platelets: 321 10*3/uL (ref 150.0–400.0)
RBC: 4.55 Mil/uL (ref 3.87–5.11)
RDW: 13.3 % (ref 11.5–15.5)
WBC: 8.1 10*3/uL (ref 4.0–10.5)

## 2016-12-02 LAB — LIPID PANEL
CHOLESTEROL: 230 mg/dL — AB (ref 0–200)
HDL: 54.9 mg/dL (ref >39.00)
LDL Cholesterol: 158 mg/dL — ABNORMAL HIGH (ref 0–99)
NonHDL: 175.39
TRIGLYCERIDES: 88 mg/dL (ref 0.0–149.0)
Total CHOL/HDL Ratio: 4
VLDL: 17.6 mg/dL (ref 0.0–40.0)

## 2016-12-02 LAB — COMPREHENSIVE METABOLIC PANEL
ALBUMIN: 4.2 g/dL (ref 3.5–5.2)
ALK PHOS: 54 U/L (ref 39–117)
ALT: 15 U/L (ref 0–35)
AST: 16 U/L (ref 0–37)
BUN: 10 mg/dL (ref 6–23)
CALCIUM: 9.8 mg/dL (ref 8.4–10.5)
CO2: 29 mEq/L (ref 19–32)
Chloride: 104 mEq/L (ref 96–112)
Creatinine, Ser: 0.81 mg/dL (ref 0.40–1.20)
GFR: 92.04 mL/min (ref >60.00)
Glucose, Bld: 100 mg/dL — ABNORMAL HIGH (ref 70–99)
POTASSIUM: 4 meq/L (ref 3.5–5.1)
Sodium: 140 mEq/L (ref 135–145)
TOTAL PROTEIN: 8 g/dL (ref 6.0–8.3)
Total Bilirubin: 0.6 mg/dL (ref 0.2–1.2)

## 2016-12-02 LAB — HIV ANTIBODY (ROUTINE TESTING W REFLEX): HIV 1&2 Ab, 4th Generation: NONREACTIVE

## 2016-12-02 LAB — TSH: TSH: 1.49 u[IU]/mL (ref 0.35–4.50)

## 2016-12-02 NOTE — Patient Instructions (Addendum)
Test(s) ordered today. Your results will be released to MyChart (or called to you) after review, usually within 72hours after test completion. If any changes need to be made, you will be notified at that same time.  All other Health Maintenance issues reviewed.   All recommended immunizations and age-appropriate screenings are up-to-date or discussed.  No immunizations administered today.   Medications reviewed and updated.  No changes recommended at this time.   Please followup in one year   Health Maintenance, Female Introduction Adopting a healthy lifestyle and getting preventive care can go a long way to promote health and wellness. Talk with your health care provider about what schedule of regular examinations is right for you. This is a good chance for you to check in with your provider about disease prevention and staying healthy. In between checkups, there are plenty of things you can do on your own. Experts have done a lot of research about which lifestyle changes and preventive measures are most likely to keep you healthy. Ask your health care provider for more information. Weight and diet Eat a healthy diet  Be sure to include plenty of vegetables, fruits, low-fat dairy products, and lean protein.  Do not eat a lot of foods high in solid fats, added sugars, or salt.  Get regular exercise. This is one of the most important things you can do for your health.  Most adults should exercise for at least 150 minutes each week. The exercise should increase your heart rate and make you sweat (moderate-intensity exercise).  Most adults should also do strengthening exercises at least twice a week. This is in addition to the moderate-intensity exercise. Maintain a healthy weight  Body mass index (BMI) is a measurement that can be used to identify possible weight problems. It estimates body fat based on height and weight. Your health care provider can help determine your BMI and help  you achieve or maintain a healthy weight.  For females 20 years of age and older:  A BMI below 18.5 is considered underweight.  A BMI of 18.5 to 24.9 is normal.  A BMI of 25 to 29.9 is considered overweight.  A BMI of 30 and above is considered obese. Watch levels of cholesterol and blood lipids  You should start having your blood tested for lipids and cholesterol at 63 years of age, then have this test every 5 years.  You may need to have your cholesterol levels checked more often if:  Your lipid or cholesterol levels are high.  You are older than 63 years of age.  You are at high risk for heart disease. Cancer screening Lung Cancer  Lung cancer screening is recommended for adults 55-80 years old who are at high risk for lung cancer because of a history of smoking.  A yearly low-dose CT scan of the lungs is recommended for people who:  Currently smoke.  Have quit within the past 15 years.  Have at least a 30-pack-year history of smoking. A pack year is smoking an average of one pack of cigarettes a day for 1 year.  Yearly screening should continue until it has been 15 years since you quit.  Yearly screening should stop if you develop a health problem that would prevent you from having lung cancer treatment. Breast Cancer  Practice breast self-awareness. This means understanding how your breasts normally appear and feel.  It also means doing regular breast self-exams. Let your health care provider know about any changes, no matter how   small.  If you are in your 20s or 30s, you should have a clinical breast exam (CBE) by a health care provider every 1-3 years as part of a regular health exam.  If you are 40 or older, have a CBE every year. Also consider having a breast X-ray (mammogram) every year.  If you have a family history of breast cancer, talk to your health care provider about genetic screening.  If you are at high risk for breast cancer, talk to your health  care provider about having an MRI and a mammogram every year.  Breast cancer gene (BRCA) assessment is recommended for women who have family members with BRCA-related cancers. BRCA-related cancers include:  Breast.  Ovarian.  Tubal.  Peritoneal cancers.  Results of the assessment will determine the need for genetic counseling and BRCA1 and BRCA2 testing. Cervical Cancer  Your health care provider may recommend that you be screened regularly for cancer of the pelvic organs (ovaries, uterus, and vagina). This screening involves a pelvic examination, including checking for microscopic changes to the surface of your cervix (Pap test). You may be encouraged to have this screening done every 3 years, beginning at age 21.  For women ages 30-65, health care providers may recommend pelvic exams and Pap testing every 3 years, or they may recommend the Pap and pelvic exam, combined with testing for human papilloma virus (HPV), every 5 years. Some types of HPV increase your risk of cervical cancer. Testing for HPV may also be done on women of any age with unclear Pap test results.  Other health care providers may not recommend any screening for nonpregnant women who are considered low risk for pelvic cancer and who do not have symptoms. Ask your health care provider if a screening pelvic exam is right for you.  If you have had past treatment for cervical cancer or a condition that could lead to cancer, you need Pap tests and screening for cancer for at least 20 years after your treatment. If Pap tests have been discontinued, your risk factors (such as having a new sexual partner) need to be reassessed to determine if screening should resume. Some women have medical problems that increase the chance of getting cervical cancer. In these cases, your health care provider may recommend more frequent screening and Pap tests. Colorectal Cancer  This type of cancer can be detected and often prevented.  Routine  colorectal cancer screening usually begins at 63 years of age and continues through 63 years of age.  Your health care provider may recommend screening at an earlier age if you have risk factors for colon cancer.  Your health care provider may also recommend using home test kits to check for hidden blood in the stool.  A small camera at the end of a tube can be used to examine your colon directly (sigmoidoscopy or colonoscopy). This is done to check for the earliest forms of colorectal cancer.  Routine screening usually begins at age 50.  Direct examination of the colon should be repeated every 5-10 years through 63 years of age. However, you may need to be screened more often if early forms of precancerous polyps or small growths are found. Skin Cancer  Check your skin from head to toe regularly.  Tell your health care provider about any new moles or changes in moles, especially if there is a change in a mole's shape or color.  Also tell your health care provider if you have a mole that is   larger than the size of a pencil eraser.  Always use sunscreen. Apply sunscreen liberally and repeatedly throughout the day.  Protect yourself by wearing long sleeves, pants, a wide-brimmed hat, and sunglasses whenever you are outside. Heart disease, diabetes, and high blood pressure  High blood pressure causes heart disease and increases the risk of stroke. High blood pressure is more likely to develop in:  People who have blood pressure in the high end of the normal range (130-139/85-89 mm Hg).  People who are overweight or obese.  People who are African American.  If you are 18-39 years of age, have your blood pressure checked every 3-5 years. If you are 40 years of age or older, have your blood pressure checked every year. You should have your blood pressure measured twice-once when you are at a hospital or clinic, and once when you are not at a hospital or clinic. Record the average of the two  measurements. To check your blood pressure when you are not at a hospital or clinic, you can use:  An automated blood pressure machine at a pharmacy.  A home blood pressure monitor.  If you are between 55 years and 79 years old, ask your health care provider if you should take aspirin to prevent strokes.  Have regular diabetes screenings. This involves taking a blood sample to check your fasting blood sugar level.  If you are at a normal weight and have a low risk for diabetes, have this test once every three years after 63 years of age.  If you are overweight and have a high risk for diabetes, consider being tested at a younger age or more often. Preventing infection Hepatitis B  If you have a higher risk for hepatitis B, you should be screened for this virus. You are considered at high risk for hepatitis B if:  You were born in a country where hepatitis B is common. Ask your health care provider which countries are considered high risk.  Your parents were born in a high-risk country, and you have not been immunized against hepatitis B (hepatitis B vaccine).  You have HIV or AIDS.  You use needles to inject street drugs.  You live with someone who has hepatitis B.  You have had sex with someone who has hepatitis B.  You get hemodialysis treatment.  You take certain medicines for conditions, including cancer, organ transplantation, and autoimmune conditions. Hepatitis C  Blood testing is recommended for:  Everyone born from 1945 through 1965.  Anyone with known risk factors for hepatitis C. Sexually transmitted infections (STIs)  You should be screened for sexually transmitted infections (STIs) including gonorrhea and chlamydia if:  You are sexually active and are younger than 63 years of age.  You are older than 63 years of age and your health care provider tells you that you are at risk for this type of infection.  Your sexual activity has changed since you were last  screened and you are at an increased risk for chlamydia or gonorrhea. Ask your health care provider if you are at risk.  If you do not have HIV, but are at risk, it may be recommended that you take a prescription medicine daily to prevent HIV infection. This is called pre-exposure prophylaxis (PrEP). You are considered at risk if:  You are sexually active and do not regularly use condoms or know the HIV status of your partner(s).  You take drugs by injection.  You are sexually active with a partner   who has HIV. Talk with your health care provider about whether you are at high risk of being infected with HIV. If you choose to begin PrEP, you should first be tested for HIV. You should then be tested every 3 months for as long as you are taking PrEP. Pregnancy  If you are premenopausal and you may become pregnant, ask your health care provider about preconception counseling.  If you may become pregnant, take 400 to 800 micrograms (mcg) of folic acid every day.  If you want to prevent pregnancy, talk to your health care provider about birth control (contraception). Osteoporosis and menopause  Osteoporosis is a disease in which the bones lose minerals and strength with aging. This can result in serious bone fractures. Your risk for osteoporosis can be identified using a bone density scan.  If you are 65 years of age or older, or if you are at risk for osteoporosis and fractures, ask your health care provider if you should be screened.  Ask your health care provider whether you should take a calcium or vitamin D supplement to lower your risk for osteoporosis.  Menopause may have certain physical symptoms and risks.  Hormone replacement therapy may reduce some of these symptoms and risks. Talk to your health care provider about whether hormone replacement therapy is right for you. Follow these instructions at home:  Schedule regular health, dental, and eye exams.  Stay current with your  immunizations.  Do not use any tobacco products including cigarettes, chewing tobacco, or electronic cigarettes.  If you are pregnant, do not drink alcohol.  If you are breastfeeding, limit how much and how often you drink alcohol.  Limit alcohol intake to no more than 1 drink per day for nonpregnant women. One drink equals 12 ounces of beer, 5 ounces of wine, or 1 ounces of hard liquor.  Do not use street drugs.  Do not share needles.  Ask your health care provider for help if you need support or information about quitting drugs.  Tell your health care provider if you often feel depressed.  Tell your health care provider if you have ever been abused or do not feel safe at home. This information is not intended to replace advice given to you by your health care provider. Make sure you discuss any questions you have with your health care provider. Document Released: 05/25/2011 Document Revised: 04/16/2016 Document Reviewed: 08/13/2015  2017 [employer]    

## 2016-12-02 NOTE — Progress Notes (Signed)
Subjective:    Patient ID: Sheila Solomon, female    DOB: 1956-04-12, 61 y.o.   MRN: [id number]  HPI She is here for a physical exam.   She denies changes in her history of family history.  She has no concerns.   She is exercising, but only once a week.   Medications and allergies reviewed with patient and updated if appropriate.  Patient Active Problem List   Diagnosis Date Noted  . VITAMIN D DEFICIENCY 08/14/2010  . OSTEOPENIA 11/12/2008  . ELEVATED BLOOD PRESSURE WITHOUT DIAGNOSIS OF HYPERTENSION 11/12/2008  . HYPERLIPIDEMIA 11/14/2007    Current Outpatient Prescriptions on File Prior to Visit  Medication Sig Dispense Refill  . Biotin 1000 MCG tablet Take 1,000 mcg by mouth as needed.    . calcium carbonate (OS-CAL) 600 MG TABS tablet Take 600 mg by mouth 2 (two) times daily with a meal.    . Cholecalciferol (VITAMIN D3) 2000 UNITS TABS Take 1 tablet by mouth daily.    Marland Kitchen Cod Liver Oil CAPS Take 1 capsule by mouth daily.    . ferrous sulfate 325 (65 FE) MG tablet Take 325 mg by mouth as needed.    Marland Kitchen MAGNESIUM PO Take by mouth.    . Multiple Vitamin (MULTIVITAMIN) tablet Take 1 tablet by mouth daily.    Marland Kitchen POTASSIUM PO Take by mouth.    . vitamin C (ASCORBIC ACID) 500 MG tablet Take 500 mg by mouth as needed.     No current facility-administered medications on file prior to visit.     Past Medical History:  Diagnosis Date  . Hyperlipidemia   . Osteopenia    [place] @ [facility]  . Vitamin D deficiency     Past Surgical History:  Procedure Laterality Date  . COLONOSCOPY  2012    [facility];negative; due 2022  . G 1 P 1     C section  . TOTAL ABDOMINAL HYSTERECTOMY     fibroids & dysfunctional menses    Social History   Social History  . Marital status: Divorced    Spouse name: N/A  . Number of children: N/A  . Years of education: N/A   Social History Main Topics  . Smoking status: Never Smoker  . Smokeless tobacco: Not on file  . Alcohol use No   Comment:  rare wine  . Drug use: No  . Sexual activity: Not on file   Other Topics Concern  . Not on file   Social History Narrative  . No narrative on file    Family History  Problem Relation Age of Onset  . Hypertension Mother   . Coronary artery disease Mother     4 v CBAG in 27s  . Hypertension Father   . Diabetes Father   . Other Father     elevated PSA  . Hypertension Sister   . Hypertension Brother   . Stroke Paternal Grandmother     in 40s    Review of Systems  Constitutional: Negative for chills and fever.  HENT: Negative for hearing loss.   Eyes: Negative for visual disturbance.  Respiratory: Negative for cough, shortness of breath and wheezing.   Cardiovascular: Negative for chest pain, palpitations and leg swelling.  Gastrointestinal: Negative for abdominal pain, blood in stool, constipation, diarrhea and nausea.       No gerd  Genitourinary: Negative for dysuria and hematuria.  Musculoskeletal: Negative for arthralgias and back pain.  Skin: Negative for color change and  rash.  Neurological: Negative for dizziness, light-headedness and headaches.  Psychiatric/Behavioral: Negative for dysphoric mood. The patient is not nervous/anxious.        Objective:   Vitals:   12/02/16 0837  BP: 134/86  Pulse: 80  Resp: 16  Temp: 98.7 F (37.1 C)   Filed Weights   12/02/16 0837  Weight: 193 lb (87.5 kg)   Body mass index is 31.15 kg/m.  Wt Readings from Last 3 Encounters:  12/02/16 193 lb (87.5 kg)  11/05/16 190 lb (86.2 kg)  11/14/15 196 lb (88.9 kg)     Physical Exam Constitutional: She appears well-developed and well-nourished. No distress.  HENT:  Head: Normocephalic and atraumatic.  Right Ear: External ear normal. Normal ear canal and TM Left Ear: External ear normal.  Normal ear canal and TM Mouth/Throat: Oropharynx is clear and moist.  Eyes: Conjunctivae and EOM are normal.  Neck: Neck supple. No tracheal deviation present. No thyromegaly  present.  No carotid bruit  Cardiovascular: Normal rate, regular rhythm and normal heart sounds.   No murmur heard.  No edema. Pulmonary/Chest: Effort normal and breath sounds normal. No respiratory distress. She has no wheezes. She has no rales.  Breast: deferred to Gyn Abdominal: Soft. She exhibits no distension. There is no tenderness.  Lymphadenopathy: She has no cervical adenopathy.  Skin: Skin is warm and dry. She is not diaphoretic.  Psychiatric: She has a normal mood and affect. Her behavior is normal.         Assessment & Plan:   Physical exam: Screening blood work   ordered Immunizations  Discussed shingles vaccine, others are up to date Colonoscopy  Up to date  Mammogram - Up to date  - will get report Gyn   Up to date  Dexa - done 2015 - has osteopenia Eye exams  Up to date  EKG - last done 2013 -  Will orderec Exercise- once a week - will increase Weight - advised weight loss Skin - no conerns Substance abuse  none  See Problem List for Assessment and Plan of chronic medical problems.  FU annually

## 2016-12-02 NOTE — Assessment & Plan Note (Signed)
Dexa ordered  Continue calcium/ vita d Exercise stressed

## 2016-12-02 NOTE — Progress Notes (Signed)
Pre visit review using our clinic review tool, if applicable. No additional management support is needed unless otherwise documented below in the visit note. 

## 2016-12-02 NOTE — Assessment & Plan Note (Signed)
BP borderline high Compliant with a low sodium diet Exercising - but will try to increase Work on weight loss Will monitor

## 2016-12-04 ENCOUNTER — Encounter: Payer: Self-pay | Admitting: Internal Medicine

## 2017-03-29 ENCOUNTER — Ambulatory Visit (INDEPENDENT_AMBULATORY_CARE_PROVIDER_SITE_OTHER)
Admission: RE | Admit: 2017-03-29 | Discharge: 2017-03-29 | Disposition: A | Discharge status: Home / Self Care | Payer: Managed Care, Other (non HMO) | Source: Ambulatory Visit | Attending: Internal Medicine | Admitting: Internal Medicine

## 2017-03-29 ENCOUNTER — Other Ambulatory Visit: Payer: BLUE CROSS/BLUE SHIELD

## 2017-03-29 DIAGNOSIS — M85861 Other specified disorders of bone density and structure, right lower leg: Secondary | ICD-10-CM | POA: Diagnosis not present

## 2017-03-29 DIAGNOSIS — Z13820 Encounter for screening for osteoporosis: Secondary | ICD-10-CM

## 2017-03-31 DIAGNOSIS — M85861 Other specified disorders of bone density and structure, right lower leg: Secondary | ICD-10-CM | POA: Diagnosis not present

## 2017-04-01 ENCOUNTER — Encounter: Payer: Self-pay | Admitting: Internal Medicine

## 2017-06-09 ENCOUNTER — Telehealth: Payer: Self-pay | Admitting: Internal Medicine

## 2017-06-09 NOTE — Telephone Encounter (Signed)
She needs to be evaluated.

## 2017-06-09 NOTE — Telephone Encounter (Signed)
Please advise, pt has not been seen since 12/02/16

## 2017-06-09 NOTE — Telephone Encounter (Signed)
Patient states she is requesting Dr. Quay Burow to send an antibiotic to her pharmacy for congestion.  States she works out of town and that Dr. Quay Burow has done this for her before.  Patient uses [facility] on [address].

## 2017-06-09 NOTE — Telephone Encounter (Signed)
Spoke with pt to inform. Pt states she is out of town and unable to come in for an appt.

## 2017-12-03 ENCOUNTER — Encounter: Payer: BLUE CROSS/BLUE SHIELD | Admitting: Internal Medicine

## 2017-12-28 ENCOUNTER — Encounter: Payer: Self-pay | Admitting: Gastroenterology

## 2017-12-30 NOTE — Progress Notes (Signed)
Subjective:    Patient ID: Sheila Solomon, female    DOB: April 25, 1956, 62 y.o.   MRN: [id number]  HPI She is here for a physical exam.   Right first Toe pain:  She will have severe pain at times.  It has been intermittent for a very long time.  She has some discoloration in her nail.  She has seen a podiatrist in the past and was prescribed a cream for toenail fungus and it helped temporarily.  The nail is less attached on the lateral aspect.  Today she has no pain.  Pressing on the nail can hurt sometimes.    Medications and allergies reviewed with patient and updated if appropriate.  Patient Active Problem List   Diagnosis Date Noted  . VITAMIN D DEFICIENCY 08/14/2010  . Osteopenia 11/12/2008  . ELEVATED BLOOD PRESSURE WITHOUT DIAGNOSIS OF HYPERTENSION 11/12/2008  . HYPERLIPIDEMIA 11/14/2007    Current Outpatient Medications on File Prior to Visit  Medication Sig Dispense Refill  . Biotin 1000 MCG tablet Take 1,000 mcg by mouth as needed.    . calcium carbonate (OS-CAL) 600 MG TABS tablet Take 600 mg by mouth 2 (two) times daily with a meal.    . Cholecalciferol (VITAMIN D3) 2000 UNITS TABS Take 1 tablet by mouth daily.    Marland Kitchen Cod Liver Oil CAPS Take 1 capsule by mouth daily.    Marland Kitchen MAGNESIUM PO Take by mouth.    . Multiple Vitamin (MULTIVITAMIN) tablet Take 1 tablet by mouth daily.    Marland Kitchen POTASSIUM PO Take by mouth.    . vitamin C (ASCORBIC ACID) 500 MG tablet Take 500 mg by mouth as needed.     No current facility-administered medications on file prior to visit.     Past Medical History:  Diagnosis Date  . Hyperlipidemia   . Osteopenia    [place] [facility]  . Vitamin D deficiency     Past Surgical History:  Procedure Laterality Date  . COLONOSCOPY  2012    [facility];negative; due 2022  . G 1 P 1     C section  . TOTAL ABDOMINAL HYSTERECTOMY     fibroids & dysfunctional menses    Social History   Socioeconomic History  . Marital status: Divorced    Spouse name:  None  . Number of children: None  . Years of education: None  . Highest education level: None  Social Needs  . Financial resource strain: None  . Food insecurity - worry: None  . Food insecurity - inability: None  . Transportation needs - medical: None  . Transportation needs - non-medical: None  Occupational History  . None  Tobacco Use  . Smoking status: Never Smoker  . Smokeless tobacco: Never Used  Substance and Sexual Activity  . Alcohol use: No    Comment:  rare wine  . Drug use: No  . Sexual activity: None  Other Topics Concern  . None  Social History Narrative   Exercise: 1 day a week    Family History  Problem Relation Age of Onset  . Hypertension Mother   . Coronary artery disease Mother        4 v CBAG in 9s  . Hypertension Father   . Diabetes Father   . Other Father        elevated PSA  . Dementia Father   . Hypertension Sister   . Hypertension Brother   . Stroke Paternal Grandmother  in 70s    Review of Systems  Constitutional: Negative for chills and fever.  Eyes: Negative for visual disturbance.  Respiratory: Negative for cough, shortness of breath and wheezing.   Cardiovascular: Negative for chest pain, palpitations and leg swelling.  Gastrointestinal: Negative for abdominal pain, blood in stool, constipation, diarrhea and nausea.       No gerd  Genitourinary: Negative for dysuria and hematuria.  Musculoskeletal: Negative for arthralgias and back pain.  Skin: Negative for color change and rash.  Neurological: Negative for light-headedness and headaches.  Psychiatric/Behavioral: Negative for dysphoric mood. The patient is not nervous/anxious.        Objective:   Vitals:   12/31/17 0820  BP: 134/86  Pulse: 86  Resp: 16  Temp: 99 F (37.2 C)  SpO2: 97%   Filed Weights   12/31/17 0820  Weight: 197 lb (89.4 kg)   Body mass index is 31.8 kg/m.  Wt Readings from Last 3 Encounters:  12/31/17 197 lb (89.4 kg)  12/02/16 193 lb  (87.5 kg)  11/05/16 190 lb (86.2 kg)     Physical Exam Constitutional: She appears well-developed and well-nourished. No distress.  HENT:  Head: Normocephalic and atraumatic.  Right Ear: External ear normal. Normal ear canal and TM Left Ear: External ear normal.  Normal ear canal and TM Mouth/Throat: Oropharynx is clear and moist.  Eyes: Conjunctivae and EOM are normal.  Neck: Neck supple. No tracheal deviation present. No thyromegaly present.  No carotid bruit  Cardiovascular: Normal rate, regular rhythm and normal heart sounds.   No murmur heard.  No edema. Pulmonary/Chest: Effort normal and breath sounds normal. No respiratory distress. She has no wheezes. She has no rales.  Breast: deferred to Gyn Abdominal: Soft. She exhibits no distension. There is no tenderness.  Lymphadenopathy: She has no cervical adenopathy.  Skin: Skin is warm and dry. She is not diaphoretic.  Psychiatric: She has a normal mood and affect. Her behavior is normal.        Assessment & Plan:   Physical exam: Screening blood work  ordered Immunizations   Discussed shingrix, td up to date Colonoscopy   Due this year - will schedule Mammogram   -  Scheduled for august - up to date Gyn   Up to date  Dexa   Up to date  Eye exams  Up to date  EKG     Last done 2013 Exercise  Regular for the past month Weight   Working on weight loss Skin  Substance abuse  See Problem List for Assessment and Plan of chronic medical problems.

## 2017-12-30 NOTE — Patient Instructions (Addendum)
Test(s) ordered today. Your results will be released to [facility] (or called to you) after review, usually within 72hours after test completion. If any changes need to be made, you will be notified at that same time.  All other Health Maintenance issues reviewed.   All recommended immunizations and age-appropriate screenings are up-to-date or discussed.  No immunizations administered today.   Medications reviewed and updated.  No changes recommended at this time.   A referral was ordered for podiatry  Please followup in one year   Health Maintenance, Female Adopting a healthy lifestyle and getting preventive care can go a long way to promote health and wellness. Talk with your health care provider about what schedule of regular examinations is right for you. This is a good chance for you to check in with your provider about disease prevention and staying healthy. In between checkups, there are plenty of things you can do on your own. Experts have done a lot of research about which lifestyle changes and preventive measures are most likely to keep you healthy. Ask your health care provider for more information. Weight and diet Eat a healthy diet  Be sure to include plenty of vegetables, fruits, low-fat dairy products, and lean protein.  Do not eat a lot of foods high in solid fats, added sugars, or salt.  Get regular exercise. This is one of the most important things you can do for your health. ? Most adults should exercise for at least 150 minutes each week. The exercise should increase your heart rate and make you sweat (moderate-intensity exercise). ? Most adults should also do strengthening exercises at least twice a week. This is in addition to the moderate-intensity exercise.  Maintain a healthy weight  Body mass index (BMI) is a measurement that can be used to identify possible weight problems. It estimates body fat based on height and weight. Your health care provider can help  determine your BMI and help you achieve or maintain a healthy weight.  For females 9 years of age and older: ? A BMI below 18.5 is considered underweight. ? A BMI of 18.5 to 24.9 is normal. ? A BMI of 25 to 29.9 is considered overweight. ? A BMI of 30 and above is considered obese.  Watch levels of cholesterol and blood lipids  You should start having your blood tested for lipids and cholesterol at 64 years of age, then have this test every 5 years.  You may need to have your cholesterol levels checked more often if: ? Your lipid or cholesterol levels are high. ? You are older than 63 years of age. ? You are at high risk for heart disease.  Cancer screening Lung Cancer  Lung cancer screening is recommended for adults 68-86 years old who are at high risk for lung cancer because of a history of smoking.  A yearly low-dose CT scan of the lungs is recommended for people who: ? Currently smoke. ? Have quit within the past 15 years. ? Have at least a 30-pack-year history of smoking. A pack year is smoking an average of one pack of cigarettes a day for 1 year.  Yearly screening should continue until it has been 15 years since you quit.  Yearly screening should stop if you develop a health problem that would prevent you from having lung cancer treatment.  Breast Cancer  Practice breast self-awareness. This means understanding how your breasts normally appear and feel.  It also means doing regular breast self-exams. Let your  health care provider know about any changes, no matter how small.  If you are in your 20s or 30s, you should have a clinical breast exam (CBE) by a health care provider every 1-3 years as part of a regular health exam.  If you are 42 or older, have a CBE every year. Also consider having a breast X-ray (mammogram) every year.  If you have a family history of breast cancer, talk to your health care provider about genetic screening.  If you are at high risk for  breast cancer, talk to your health care provider about having an MRI and a mammogram every year.  Breast cancer gene (BRCA) assessment is recommended for women who have family members with BRCA-related cancers. BRCA-related cancers include: ? Breast. ? Ovarian. ? Tubal. ? Peritoneal cancers.  Results of the assessment will determine the need for genetic counseling and BRCA1 and BRCA2 testing.  Cervical Cancer Your health care provider may recommend that you be screened regularly for cancer of the pelvic organs (ovaries, uterus, and vagina). This screening involves a pelvic examination, including checking for microscopic changes to the surface of your cervix (Pap test). You may be encouraged to have this screening done every 3 years, beginning at age 23.  For women ages 54-65, health care providers may recommend pelvic exams and Pap testing every 3 years, or they may recommend the Pap and pelvic exam, combined with testing for human papilloma virus (HPV), every 5 years. Some types of HPV increase your risk of cervical cancer. Testing for HPV may also be done on women of any age with unclear Pap test results.  Other health care providers may not recommend any screening for nonpregnant women who are considered low risk for pelvic cancer and who do not have symptoms. Ask your health care provider if a screening pelvic exam is right for you.  If you have had past treatment for cervical cancer or a condition that could lead to cancer, you need Pap tests and screening for cancer for at least 20 years after your treatment. If Pap tests have been discontinued, your risk factors (such as having a new sexual partner) need to be reassessed to determine if screening should resume. Some women have medical problems that increase the chance of getting cervical cancer. In these cases, your health care provider may recommend more frequent screening and Pap tests.  Colorectal Cancer  This type of cancer can be  detected and often prevented.  Routine colorectal cancer screening usually begins at 64 years of age and continues through 64 years of age.  Your health care provider may recommend screening at an earlier age if you have risk factors for colon cancer.  Your health care provider may also recommend using home test kits to check for hidden blood in the stool.  A small camera at the end of a tube can be used to examine your colon directly (sigmoidoscopy or colonoscopy). This is done to check for the earliest forms of colorectal cancer.  Routine screening usually begins at age 33.  Direct examination of the colon should be repeated every 5-10 years through 64 years of age. However, you may need to be screened more often if early forms of precancerous polyps or small growths are found.  Skin Cancer  Check your skin from head to toe regularly.  Tell your health care provider about any new moles or changes in moles, especially if there is a change in a mole's shape or color.  Also  tell your health care provider if you have a mole that is larger than the size of a pencil eraser.  Always use sunscreen. Apply sunscreen liberally and repeatedly throughout the day.  Protect yourself by wearing long sleeves, pants, a wide-brimmed hat, and sunglasses whenever you are outside.  Heart disease, diabetes, and high blood pressure  High blood pressure causes heart disease and increases the risk of stroke. High blood pressure is more likely to develop in: ? People who have blood pressure in the high end of the normal range (130-139/85-89 mm Hg). ? People who are overweight or obese. ? People who are African American.  If you are 43-71 years of age, have your blood pressure checked every 3-5 years. If you are 78 years of age or older, have your blood pressure checked every year. You should have your blood pressure measured twice-once when you are at a hospital or clinic, and once when you are not at a  hospital or clinic. Record the average of the two measurements. To check your blood pressure when you are not at a hospital or clinic, you can use: ? An automated blood pressure machine at a pharmacy. ? A home blood pressure monitor.  If you are between 72 years and 57 years old, ask your health care provider if you should take aspirin to prevent strokes.  Have regular diabetes screenings. This involves taking a blood sample to check your fasting blood sugar level. ? If you are at a normal weight and have a low risk for diabetes, have this test once every three years after 64 years of age. ? If you are overweight and have a high risk for diabetes, consider being tested at a younger age or more often. Preventing infection Hepatitis B  If you have a higher risk for hepatitis B, you should be screened for this virus. You are considered at high risk for hepatitis B if: ? You were born in a country where hepatitis B is common. Ask your health care provider which countries are considered high risk. ? Your parents were born in a high-risk country, and you have not been immunized against hepatitis B (hepatitis B vaccine). ? You have HIV or AIDS. ? You use needles to inject street drugs. ? You live with someone who has hepatitis B. ? You have had sex with someone who has hepatitis B. ? You get hemodialysis treatment. ? You take certain medicines for conditions, including cancer, organ transplantation, and autoimmune conditions.  Hepatitis C  Blood testing is recommended for: ? Everyone born from [birth year] through 1967. ? Anyone with known risk factors for hepatitis C.  Sexually transmitted infections (STIs)  You should be screened for sexually transmitted infections (STIs) including gonorrhea and chlamydia if: ? You are sexually active and are younger than 64 years of age. ? You are older than 64 years of age and your health care provider tells you that you are at risk for this type of  infection. ? Your sexual activity has changed since you were last screened and you are at an increased risk for chlamydia or gonorrhea. Ask your health care provider if you are at risk.  If you do not have HIV, but are at risk, it may be recommended that you take a prescription medicine daily to prevent HIV infection. This is called pre-exposure prophylaxis (PrEP). You are considered at risk if: ? You are sexually active and do not regularly use condoms or know the HIV status of your  partner(s). ? You take drugs by injection. ? You are sexually active with a partner who has HIV.  Talk with your health care provider about whether you are at high risk of being infected with HIV. If you choose to begin PrEP, you should first be tested for HIV. You should then be tested every 3 months for as long as you are taking PrEP. Pregnancy  If you are premenopausal and you may become pregnant, ask your health care provider about preconception counseling.  If you may become pregnant, take 400 to 800 micrograms (mcg) of folic acid every day.  If you want to prevent pregnancy, talk to your health care provider about birth control (contraception). Osteoporosis and menopause  Osteoporosis is a disease in which the bones lose minerals and strength with aging. This can result in serious bone fractures. Your risk for osteoporosis can be identified using a bone density scan.  If you are 34 years of age or older, or if you are at risk for osteoporosis and fractures, ask your health care provider if you should be screened.  Ask your health care provider whether you should take a calcium or vitamin D supplement to lower your risk for osteoporosis.  Menopause may have certain physical symptoms and risks.  Hormone replacement therapy may reduce some of these symptoms and risks. Talk to your health care provider about whether hormone replacement therapy is right for you. Follow these instructions at home:  Schedule  regular health, dental, and eye exams.  Stay current with your immunizations.  Do not use any tobacco products including cigarettes, chewing tobacco, or electronic cigarettes.  If you are pregnant, do not drink alcohol.  If you are breastfeeding, limit how much and how often you drink alcohol.  Limit alcohol intake to no more than 1 drink per day for nonpregnant women. One drink equals 12 ounces of beer, 5 ounces of wine, or 1 ounces of hard liquor.  Do not use street drugs.  Do not share needles.  Ask your health care provider for help if you need support or information about quitting drugs.  Tell your health care provider if you often feel depressed.  Tell your health care provider if you have ever been abused or do not feel safe at home. This information is not intended to replace advice given to you by your health care provider. Make sure you discuss any questions you have with your health care provider. Document Released: 05/25/2011 Document Revised: 04/16/2016 Document Reviewed: 08/13/2015 Elsevier Interactive Patient Education  [employer].

## 2017-12-31 ENCOUNTER — Encounter: Payer: Self-pay | Admitting: Internal Medicine

## 2017-12-31 ENCOUNTER — Ambulatory Visit (INDEPENDENT_AMBULATORY_CARE_PROVIDER_SITE_OTHER): Payer: Managed Care, Other (non HMO) | Admitting: Internal Medicine

## 2017-12-31 ENCOUNTER — Other Ambulatory Visit (INDEPENDENT_AMBULATORY_CARE_PROVIDER_SITE_OTHER): Payer: Managed Care, Other (non HMO)

## 2017-12-31 VITALS — BP 134/86 | HR 86 | Temp 99.0°F | Resp 16 | Ht 66.0 in | Wt 197.0 lb

## 2017-12-31 DIAGNOSIS — R739 Hyperglycemia, unspecified: Secondary | ICD-10-CM | POA: Diagnosis not present

## 2017-12-31 DIAGNOSIS — Z0000 Encounter for general adult medical examination without abnormal findings: Secondary | ICD-10-CM

## 2017-12-31 DIAGNOSIS — M79674 Pain in right toe(s): Secondary | ICD-10-CM

## 2017-12-31 DIAGNOSIS — E669 Obesity, unspecified: Secondary | ICD-10-CM | POA: Diagnosis not present

## 2017-12-31 DIAGNOSIS — M85861 Other specified disorders of bone density and structure, right lower leg: Secondary | ICD-10-CM | POA: Diagnosis not present

## 2017-12-31 DIAGNOSIS — E782 Mixed hyperlipidemia: Secondary | ICD-10-CM

## 2017-12-31 DIAGNOSIS — R7303 Prediabetes: Secondary | ICD-10-CM | POA: Insufficient documentation

## 2017-12-31 LAB — CBC WITH DIFFERENTIAL/PLATELET
BASOS PCT: 1.7 %
Basophils Absolute: 128 cells/uL (ref 0–200)
Eosinophils Absolute: 308 cells/uL (ref 15–500)
Eosinophils Relative: 4.1 %
HCT: 43.5 % (ref 35.0–45.0)
Hemoglobin: 14.6 g/dL (ref 11.7–15.5)
Lymphs Abs: 2393 cells/uL (ref 850–3900)
MCH: 29.8 pg (ref 27.0–33.0)
MCHC: 33.6 g/dL (ref 32.0–36.0)
MCV: 88.8 fL (ref 80.0–100.0)
MONOS PCT: 8.4 %
MPV: 10 fL (ref 7.5–12.5)
NEUTROS PCT: 53.9 %
Neutro Abs: 4043 cells/uL (ref 1500–7800)
PLATELETS: 304 10*3/uL (ref 140–400)
RBC: 4.9 10*6/uL (ref 3.80–5.10)
RDW: 12 % (ref 11.0–15.0)
TOTAL LYMPHOCYTE: 31.9 %
WBC mixed population: 630 cells/uL (ref 200–950)
WBC: 7.5 10*3/uL (ref 3.8–10.8)

## 2017-12-31 LAB — COMPREHENSIVE METABOLIC PANEL
ALT: 20 U/L (ref 0–35)
AST: 19 U/L (ref 0–37)
Albumin: 4.1 g/dL (ref 3.5–5.2)
Alkaline Phosphatase: 50 U/L (ref 39–117)
BILIRUBIN TOTAL: 0.8 mg/dL (ref 0.2–1.2)
BUN: 11 mg/dL (ref 6–23)
CALCIUM: 9.5 mg/dL (ref 8.4–10.5)
CO2: 30 mEq/L (ref 19–32)
CREATININE: 0.87 mg/dL (ref 0.40–1.20)
Chloride: 104 mEq/L (ref 96–112)
GFR: 84.46 mL/min (ref >60.00)
Glucose, Bld: 97 mg/dL (ref 70–99)
Potassium: 4.1 mEq/L (ref 3.5–5.1)
Sodium: 139 mEq/L (ref 135–145)
TOTAL PROTEIN: 7.6 g/dL (ref 6.0–8.3)

## 2017-12-31 LAB — LIPID PANEL
CHOLESTEROL: 188 mg/dL (ref 0–200)
HDL: 45.8 mg/dL (ref >39.00)
LDL Cholesterol: 126 mg/dL — ABNORMAL HIGH (ref 0–99)
NonHDL: 142.22
TRIGLYCERIDES: 79 mg/dL (ref 0.0–149.0)
Total CHOL/HDL Ratio: 4
VLDL: 15.8 mg/dL (ref 0.0–40.0)

## 2017-12-31 LAB — HEMOGLOBIN A1C: Hgb A1c MFr Bld: 5.8 % (ref 4.6–6.5)

## 2017-12-31 LAB — TSH: TSH: 1.69 u[IU]/mL (ref 0.35–4.50)

## 2017-12-31 NOTE — Assessment & Plan Note (Signed)
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

## 2017-12-31 NOTE — Assessment & Plan Note (Signed)
Exercising Working on weight loss - trying to eat more healthy and decreased portions

## 2017-12-31 NOTE — Assessment & Plan Note (Signed)
Check lipid panel  Regular exercise and healthy diet encouraged  

## 2017-12-31 NOTE — Assessment & Plan Note (Signed)
fam hx of diabetes H/o elevated a1c Check a1c Low sugar / carb diet Stressed regular exercise, weight loss

## 2017-12-31 NOTE — Assessment & Plan Note (Signed)
dexa up to date Taking vitamin d Exercising dexa due 2021

## 2018-01-01 ENCOUNTER — Encounter: Payer: Self-pay | Admitting: Internal Medicine

## 2018-01-01 DIAGNOSIS — R7303 Prediabetes: Secondary | ICD-10-CM

## 2018-01-01 DIAGNOSIS — E669 Obesity, unspecified: Secondary | ICD-10-CM

## 2018-01-12 ENCOUNTER — Encounter: Payer: Self-pay | Admitting: Gastroenterology

## 2018-01-12 ENCOUNTER — Telehealth: Payer: Self-pay | Admitting: Internal Medicine

## 2018-01-12 NOTE — Telephone Encounter (Signed)
Copied from [place] [id number]. Topic: Quick Communication - See Telephone Encounter >> Jan 12, 2018  1:44 PM Arletha Grippe wrote: CRM for notification. See Telephone encounter for:   01/12/18. Pt is asking for a call back regarding information about an appointment that the pt is supposed to make,  when I asked if it was a referral, I was told no, and not given any further information. Pt wants a cb from the [facility] [phone]

## 2018-01-12 NOTE — Telephone Encounter (Signed)
Spoke with pt, she is needing to schedule a colonoscopy. Transferred her to GI to make an appt.

## 2018-01-31 ENCOUNTER — Other Ambulatory Visit: Payer: Self-pay

## 2018-01-31 ENCOUNTER — Ambulatory Visit (AMBULATORY_SURGERY_CENTER): Payer: Self-pay

## 2018-01-31 VITALS — Ht 66.0 in | Wt 198.8 lb

## 2018-01-31 DIAGNOSIS — Z1211 Encounter for screening for malignant neoplasm of colon: Secondary | ICD-10-CM

## 2018-01-31 MED ORDER — SOD PHOS MONO-SOD PHOS DIBASIC 1.102-0.398 G PO TABS: 1.0000 | ORAL_TABLET | ORAL | 0 refills | Status: DC

## 2018-01-31 NOTE — Progress Notes (Signed)
No egg or soy allergy known to patient  No issues with past sedation with any surgeries  or procedures, no intubation problems  No diet pills per patient No home 02 use per patient  No blood thinners per patient  Pt denies issues with constipation  No A fib or A flutter  EMMI video sent to pt's e mail pt declined. Pt refused to do any liquid preps including miralax. Osmo prep given and pt read FDA warning and signed consent. Pt verbalize she cannot take liquids medications.

## 2018-02-04 ENCOUNTER — Encounter: Payer: Self-pay | Admitting: Gastroenterology

## 2018-02-14 ENCOUNTER — Encounter: Payer: Self-pay | Admitting: Internal Medicine

## 2018-02-18 ENCOUNTER — Other Ambulatory Visit: Payer: Self-pay

## 2018-02-18 ENCOUNTER — Encounter: Payer: Self-pay | Admitting: Gastroenterology

## 2018-02-18 ENCOUNTER — Ambulatory Visit (AMBULATORY_SURGERY_CENTER): Payer: Managed Care, Other (non HMO) | Admitting: Gastroenterology

## 2018-02-18 VITALS — BP 122/79 | HR 74 | Temp 97.3°F | Resp 17 | Ht 66.0 in | Wt 198.0 lb

## 2018-02-18 DIAGNOSIS — K37 Unspecified appendicitis: Secondary | ICD-10-CM | POA: Diagnosis not present

## 2018-02-18 DIAGNOSIS — K635 Polyp of colon: Secondary | ICD-10-CM

## 2018-02-18 DIAGNOSIS — D125 Benign neoplasm of sigmoid colon: Secondary | ICD-10-CM

## 2018-02-18 DIAGNOSIS — K639 Disease of intestine, unspecified: Secondary | ICD-10-CM | POA: Diagnosis not present

## 2018-02-18 DIAGNOSIS — Z1211 Encounter for screening for malignant neoplasm of colon: Secondary | ICD-10-CM

## 2018-02-18 MED ORDER — SODIUM CHLORIDE 0.9 % IV SOLN
500.0000 mL | Freq: Once | INTRAVENOUS | Status: DC
Start: 2018-02-18 — End: 2018-09-23

## 2018-02-18 NOTE — Progress Notes (Signed)
Pt's states no medical or surgical changes since previsit or office visit. 

## 2018-02-18 NOTE — Op Note (Signed)
[place] Patient Name: Sheila Solomon Procedure Date: 02/18/2018 2:39 PM MRN: [id number] Endoscopist: Ladene Artist , MD Age: 62 Referring MD:  Date of Birth: 1956/07/09 Gender: Female Account #: [id number] Procedure:                Colonoscopy Indications:              Screening for colorectal malignant neoplasm Medicines:                Monitored Anesthesia Care Procedure:                Pre-Anesthesia Assessment:                           - Prior to the procedure, a History and Physical                            was performed, and patient medications and                            allergies were reviewed. The patient's tolerance of                            previous anesthesia was also reviewed. The risks                            and benefits of the procedure and the sedation                            options and risks were discussed with the patient.                            All questions were answered, and informed consent                            was obtained. Prior Anticoagulants: The patient has                            taken no previous anticoagulant or antiplatelet                            agents. ASA Grade Assessment: II - A patient with                            mild systemic disease. After reviewing the risks                            and benefits, the patient was deemed in                            satisfactory condition to undergo the procedure.                           After obtaining informed consent, the colonoscope  was passed under direct vision. Throughout the                            procedure, the patient's blood pressure, pulse, and                            oxygen saturations were monitored continuously. The                            Model PCF-[id number]) scope was introduced                            through the anus and advanced to the the cecum,                            identified by  appendiceal orifice and ileocecal                            valve. The ileocecal valve, appendiceal orifice,                            and rectum were photographed. The quality of the                            bowel preparation was adequate. The colonoscopy was                            performed without difficulty. The patient tolerated                            the procedure well. Scope In: 2:47:56 PM Scope Out: 3:04:49 PM Scope Withdrawal Time: 0 hours 13 minutes 6 seconds  Total Procedure Duration: 0 hours 16 minutes 53 seconds  Findings:                 The perianal and digital rectal examinations were                            normal.                           Two sessile polyps were found in the sigmoid colon.                            The polyps were 6 to 7 mm in size. These polyps                            were removed with a cold snare. Resection and                            retrieval were complete.                           Two localized non-bleeding erosions were found  appendiceal orifice. No stigmata of recent bleeding                            were seen. Biopsies were taken with a cold forceps                            for histology.                           The exam was otherwise without abnormality on                            direct and retroflexion views. Complications:            No immediate complications. Estimated blood loss:                            None. Estimated Blood Loss:     Estimated blood loss: none. Impression:               - Two 6 to 7 mm polyps in the sigmoid colon,                            removed with a cold snare. Resected and retrieved.                           - Two erosions at the appendiceal orifice. Biopsied.                           - The examination was otherwise normal on direct                            and retroflexion views. Recommendation:           - Repeat colonoscopy in 5 years for  surveillance if                            polyp(s) are precancerous, otherwise 10 years.                           - Patient has a contact number available for                            emergencies. The signs and symptoms of potential                            delayed complications were discussed with the                            patient. Return to normal activities tomorrow.                            Written discharge instructions were provided to the  patient.                           - Resume previous diet.                           - Continue present medications.                           - Await pathology results. Ladene Artist, MD 02/18/2018 3:07:49 PM This report has been signed electronically.

## 2018-02-18 NOTE — Progress Notes (Signed)
Called to room to assist during endoscopic procedure.  Patient ID and intended procedure confirmed with present staff. Received instructions for my participation in the procedure from the performing physician.  

## 2018-02-18 NOTE — Patient Instructions (Signed)
YOU HAD AN ENDOSCOPIC PROCEDURE TODAY AT THE [facility]:   Refer to the procedure report that was given to you for any specific questions about what was found during the examination.  If the procedure report does not answer your questions, please call your gastroenterologist to clarify.  If you requested that your care partner not be given the details of your procedure findings, then the procedure report has been included in a sealed envelope for you to review at your convenience later.  YOU SHOULD EXPECT: Some feelings of bloating in the abdomen. Passage of more gas than usual.  Walking can help get rid of the air that was put into your GI tract during the procedure and reduce the bloating. If you had a lower endoscopy (such as a colonoscopy or flexible sigmoidoscopy) you may notice spotting of blood in your stool or on the toilet paper. If you underwent a bowel prep for your procedure, you may not have a normal bowel movement for a few days.  Please Note:  You might notice some irritation and congestion in your nose or some drainage.  This is from the oxygen used during your procedure.  There is no need for concern and it should clear up in a day or so.  SYMPTOMS TO REPORT IMMEDIATELY:   Following lower endoscopy (colonoscopy or flexible sigmoidoscopy):  Excessive amounts of blood in the stool  Significant tenderness or worsening of abdominal pains  Swelling of the abdomen that is new, acute  Fever of 100F or higher  Please see handout on polyps.  For urgent or emergent issues, a gastroenterologist can be reached at any hour by calling [phone].   DIET:  We do recommend a small meal at first, but then you may proceed to your regular diet.  Drink plenty of fluids but you should avoid alcoholic beverages for 24 hours.  ACTIVITY:  You should plan to take it easy for the rest of today and you should NOT DRIVE or use heavy machinery until tomorrow (because of the sedation  medicines used during the test).    FOLLOW UP: Our staff will call the number listed on your records the next business day following your procedure to check on you and address any questions or concerns that you may have regarding the information given to you following your procedure. If we do not reach you, we will leave a message.  However, if you are feeling well and you are not experiencing any problems, there is no need to return our call.  We will assume that you have returned to your regular daily activities without incident.  If any biopsies were taken you will be contacted by phone or by letter within the next 1-3 weeks.  Please call us at [phone] if you have not heard about the biopsies in 3 weeks.    SIGNATURES/CONFIDENTIALITY: You and/or your care partner have signed paperwork which will be entered into your electronic medical record.  These signatures attest to the fact that that the information above on your After Visit Summary has been reviewed and is understood.  Full responsibility of the confidentiality of this discharge information lies with you and/or your care-partner.   Thank you for allowing us to provide your healthcare today.  

## 2018-02-18 NOTE — Progress Notes (Signed)
Report to PACU, RN, vss, BBS= Clear.  

## 2018-02-21 ENCOUNTER — Telehealth: Payer: Self-pay | Admitting: *Deleted

## 2018-02-21 NOTE — Telephone Encounter (Signed)
  Follow up Call-  Call back number 02/18/2018  Post procedure Call Back phone  # [phone]  Permission to leave phone message Yes  Some recent data might be hidden     Patient questions:  Do you have a fever, pain , or abdominal swelling? No. Pain Score  0 *  Have you tolerated food without any problems? Yes.    Have you been able to return to your normal activities? Yes.    Do you have any questions about your discharge instructions: Diet   No. Medications  No. Follow up visit  No.  Do you have questions or concerns about your Care? No.  Actions: * If pain score is 4 or above: No action needed, pain <4.

## 2018-02-28 ENCOUNTER — Telehealth: Payer: Self-pay | Admitting: Internal Medicine

## 2018-02-28 NOTE — Telephone Encounter (Signed)
Copied from [place] [id number]. Topic: Quick Communication - See Telephone Encounter >> Feb 28, 2018  1:44 PM Bea Graff, NT wrote: CRM for notification. See Telephone encounter for: 02/28/18. Pt would like a call with her colonoscopy results.

## 2018-02-28 NOTE — Telephone Encounter (Signed)
This is for GI.

## 2018-03-08 ENCOUNTER — Telehealth: Payer: Self-pay | Admitting: Gastroenterology

## 2018-03-08 NOTE — Telephone Encounter (Signed)
Dr. Fuller Plan please review pathology and advise

## 2018-03-08 NOTE — Telephone Encounter (Signed)
Patient notified of the results and recommendations.  She will call back for any additional questions or concerns.

## 2018-03-08 NOTE — Telephone Encounter (Signed)
Left message for patient to call back  

## 2018-03-08 NOTE — Telephone Encounter (Signed)
Sheri,  Please call patient with path results and please let her know we are sorry for the delay.   Nonspecific inflammation at the appendix Hyperplastic polyps  Colonoscopy in 01/2028

## 2018-03-08 NOTE — Telephone Encounter (Signed)
Pt called inquiring about her path results. She is a bit upset because she has not heard from anybody in a couple of weeks.

## 2018-03-09 ENCOUNTER — Encounter: Payer: Managed Care, Other (non HMO) | Attending: Internal Medicine | Admitting: Registered"

## 2018-03-09 ENCOUNTER — Encounter: Payer: Self-pay | Admitting: Registered"

## 2018-03-09 DIAGNOSIS — E669 Obesity, unspecified: Secondary | ICD-10-CM | POA: Diagnosis present

## 2018-03-09 DIAGNOSIS — Z713 Dietary counseling and surveillance: Secondary | ICD-10-CM | POA: Insufficient documentation

## 2018-03-09 DIAGNOSIS — R7303 Prediabetes: Secondary | ICD-10-CM | POA: Insufficient documentation

## 2018-03-09 NOTE — Progress Notes (Signed)
Medical Nutrition Therapy:  Appt start time: 0810 end time:  0900.   Assessment:  Primary concerns today: Patient states she has family members who have diabetes with some serious complications. Although she states she is pretty healthy, having an elevated A1c made her want more direction to make sure she is doing what she can to avoid diabetes.  Patient states 6 yrs ago she exercised on a regular basis. Patient reports after moving back to [place] 4 yrs ago she got out of her routine of exercise. With the renewed commitment to her health patient states she has joined [employer] and just realized the other day with the diet and exercise changes she has made she has lost 13 lbs.   Patient states she works in [place] and has effective ways to manage her stress. Patient states she enjoys being in active environments and walks a lot at work.   Patient states her son is a Fish farm manager for an athletic (professional?) program and has encouraged her to eat better and understand food groups.   Preferred Learning Style:   No preference indicated   Learning Readiness:   Change in progress  MEDICATIONS: none (only taking vitamins)   DIETARY INTAKE:  24-hr recall:  B ( AM): smoothies make at home or get at [employer] OR oatmeal  Snk ( AM): none  L ( PM): pasta OR pizza at work (largest meal) Snk ( PM): none D ( PM): salad OR PB&J OR meat (fish), vegetable (usually gets home late, but tries not to eat after 8 pm) Snk ( PM): none Beverages: water, occasionally sweet tea  Usual physical activity: water aerobics 60 min, 3x week.   Estimated energy needs: 1800 calories 180 g carbohydrates 126 g protein 64 g fat  Progress Towards Goal(s):  In progress.   Nutritional Diagnosis:  NI-5.8.4 Inconsistent carbohydrate intake As related to avoiding carbs at some meals, but above recommendation at other meals.  As evidenced by dietary recall, elevated A1c 5.8%.    Intervention:  Nutrition  Education. Discussed A1c, eAGmg/dL and the relation to diabetes diagnosis. Discussed balanced eating, Discussed role of exercise in health and blood sugar control. Discussed potential weight loss plateau.  Plan: Changes you have made over the last year such as cutting out sweet tea and starting back in a regular exercise program, are very helpful in your goal of delaying or preventing diabetes.  Aim to eat balanced meals and snacks, including protein when eating carbohydrates. Getting plenty of vegetables and including a variety provides fiber, vitamins and minerals to support good health. Snacks are a great opportunity to get more veggies in the diet.  Some additional tweaks to your diet you may want to consider are:  Breakfast: Having fruits and veggies for breakfast is a great idea, be sure to include protein as well. Use dairy, almond milk, or water instead of juice for the liquid in your smoothie.  Lunch: Think of pasta as a side dish instead of the main course. You can try several different brands of whole grain pasta to get more nutrition. Consider getting other whole grains in your diet as well.   Here is a good website for more information on whole grains: https://wholegrainscouncil.org  Teaching Method Utilized:  Visual Auditory  Handouts given during visit include:  MyPlate  K0U  Carb sheet  Barriers to learning/adherence to lifestyle change: none  Demonstrated degree of understanding via:  Teach Back   Monitoring/Evaluation:  Dietary intake, exercise, and body  weight prn.

## 2018-03-09 NOTE — Patient Instructions (Addendum)
Changes you have made over the last year such as cutting out sweet tea and starting back in a regular exercise program, are very helpful in your goal of delaying or preventing diabetes.  Aim to eat balanced meals and snacks, including protein when eating carbohydrates. Getting plenty of vegetables and including a variety provides fiber, vitamins and minerals to support good health. Snacks are a great opportunity to get more veggies in the diet.  Some additional tweaks to your diet you may want to consider are:  Breakfast: Having fruits and veggies for breakfast is a great idea, be sure to include protein as well. Use dairy, almond milk, or water instead of juice for the liquid in your smoothie.  Lunch: Think of pasta as a side dish instead of the main course. You can try several different brands of whole grain pasta to get more nutrition. Consider getting other whole grains in your diet as well.   Here is a good website for more information on whole grains: https://wholegrainscouncil.org

## 2018-03-10 ENCOUNTER — Ambulatory Visit: Payer: Managed Care, Other (non HMO)

## 2018-03-21 ENCOUNTER — Encounter: Payer: Managed Care, Other (non HMO) | Admitting: Gastroenterology

## 2018-04-07 ENCOUNTER — Ambulatory Visit: Payer: Managed Care, Other (non HMO) | Admitting: Internal Medicine

## 2018-04-07 ENCOUNTER — Ambulatory Visit: Payer: Self-pay | Admitting: *Deleted

## 2018-04-07 NOTE — Telephone Encounter (Signed)
I returned her call.   She was requesting an antibiotic be called in for her because she is going to a wedding out of town this weekend.   When I offered to make her an appt she informed me she could not come in for an appt today or tomorrow and she is leaving Saturday morning.   I asked her how I could help her because the doctor will want to see her before prescribing an antibiotic, if she needed one.  Her throat is less sore and "getting better" since it started 4-5 days ago.   No other symptoms except a cough with brownish-green sputum.     She was at work and needed to go.  "I can't rearrange my schedule to come in for  20 minutes when she should be able to just call in something for me".     She said,  "I will check with the pharmacist and see what he recommends, thanks anyway".    Reason for Disposition . [1] Sore throat with cough/cold symptoms AND [2] present < 5 days  Answer Assessment - Initial Assessment Questions 1. ONSET: "When did the throat start hurting?" (Hours or days ago)      4-5 days ago.   It's getting better.  I'm doing salt water.   I have a cough though that has brownish-greenish color sputum.   I have a cold.   I'm going to a wedding this weekend and wondering if I need something stronger.   I'm using Advil now.   No OTC cold medication except Hall's throat lozenges.    2. SEVERITY: "How bad is the sore throat?" (Scale 1-10; mild, moderate or severe)   - MILD (1-3):  doesn't interfere with eating or normal activities   - MODERATE (4-7): interferes with eating some solids and normal activities   - SEVERE (8-10):  excruciating pain, interferes with most normal activities   - SEVERE DYSPHAGIA: can't swallow liquids, drooling     Started with chills and fever but not now.    Throat is better. 3. STREP EXPOSURE: "Has there been any exposure to strep within the past week?" If so, ask: "What type of contact occurred?"      Not I'm aware.   I work in [employer] with many people. 4.  VIRAL SYMPTOMS: "Are there any symptoms of a cold, such as a runny nose, cough, hoarse voice or red eyes?"      No runny nose.   Eyes are not bothering me. 5. FEVER: "Do you have a fever?" If so, ask: "What is your temperature, how was it measured, and when did it start?"     No 6. PUS ON THE TONSILS: "Is there pus on the tonsils in the back of your throat?"     I don't know.    I woke up one morning and could not swallow but I'm much better now. 7. OTHER SYMPTOMS: "Do you have any other symptoms?" (e.g., difficulty breathing, headache, rash)     No headaches 8. PREGNANCY: "Is there any chance you are pregnant?" "When was your last menstrual period?"     Not asked due to age.  Protocols used: SORE THROAT-A-AH

## 2018-04-08 ENCOUNTER — Encounter: Payer: Self-pay | Admitting: Family

## 2018-04-08 ENCOUNTER — Ambulatory Visit (INDEPENDENT_AMBULATORY_CARE_PROVIDER_SITE_OTHER): Payer: Managed Care, Other (non HMO) | Admitting: Family

## 2018-04-08 VITALS — BP 150/100 | HR 79 | Temp 98.4°F | Ht 66.0 in | Wt 192.8 lb

## 2018-04-08 DIAGNOSIS — J019 Acute sinusitis, unspecified: Secondary | ICD-10-CM

## 2018-04-08 MED ORDER — CEFDINIR 300 MG PO CAPS: 300.0000 mg | ORAL_CAPSULE | Freq: Two times a day (BID) | ORAL | 0 refills | Status: DC

## 2018-04-08 NOTE — Progress Notes (Signed)
Sheila Solomon is a 62 y.o. female with the following history as recorded in EpicCare:  Patient Active Problem List   Diagnosis Date Noted  . Obesity with body mass index 30 or greater 12/31/2017  . Prediabetes 12/31/2017  . Pain of toe of right foot 12/31/2017  . VITAMIN D DEFICIENCY 08/14/2010  . Osteopenia 11/12/2008  . HYPERLIPIDEMIA 11/14/2007    Current Outpatient Medications  Medication Sig Dispense Refill  . Biotin 1000 MCG tablet Take 1,000 mcg by mouth as needed.    . calcium carbonate (OS-CAL) 600 MG TABS tablet Take 600 mg by mouth 2 (two) times daily with a meal.    . Cholecalciferol (VITAMIN D3) 2000 UNITS TABS Take 1 tablet by mouth daily.    Marland Kitchen Cod Liver Oil CAPS Take 1 capsule by mouth daily.    Marland Kitchen MAGNESIUM PO Take by mouth.    . Multiple Vitamin (MULTIVITAMIN) tablet Take 1 tablet by mouth daily.    Marland Kitchen POTASSIUM PO Take by mouth.    . vitamin C (ASCORBIC ACID) 500 MG tablet Take 500 mg by mouth as needed.    . cefdinir (OMNICEF) 300 MG capsule Take 1 capsule (300 mg total) by mouth 2 (two) times daily. 20 capsule 0   Current Facility-Administered Medications  Medication Dose Route Frequency Provider Last Rate Last Dose  . 0.9 %  sodium chloride infusion  500 mL Intravenous Once Ladene Artist, MD        Allergies: Prednisone  Past Medical History:  Diagnosis Date  . Cataract   . Hyperlipidemia   . Osteopenia    [facility]  . Vitamin D deficiency     Past Surgical History:  Procedure Laterality Date  . CATARACT EXTRACTION     15 years ago  . COLONOSCOPY  2012    [facility];negative; due 2022  . G 1 P 1     C section  . TOTAL ABDOMINAL HYSTERECTOMY     fibroids & dysfunctional menses    Family History  Problem Relation Age of Onset  . Hypertension Mother   . Coronary artery disease Mother        4 v CBAG in 13s  . Hypertension Father   . Diabetes Father   . Other Father        elevated PSA  . Dementia Father   . Hypertension Sister   .  Hypertension Brother   . Stroke Paternal Grandmother        in 75s  . Colon cancer Neg Hx   . Colon polyps Neg Hx   . Esophageal cancer Neg Hx   . Stomach cancer Neg Hx   . Rectal cancer Neg Hx     Social History   Tobacco Use  . Smoking status: Never Smoker  . Smokeless tobacco: Never Used  Substance Use Topics  . Alcohol use: No    Subjective:  Patient presents with concerns for sinus infection x 1 week; Left sided facial pain/ pressure; + sore throat; leaving to fly to [place] in the am and is concerned about getting sicker on the flight; using OTC Advil cold medication with little improvement- admits only taking one per day though; no chest pain or shortness of breath;   Objective:  Vitals:   04/08/18 1602  BP: (!) 150/100  Pulse: 79  Temp: 98.4 F (36.9 C)  TempSrc: Oral  SpO2: 96%  Weight: 192 lb 12 oz (87.4 kg)  Height: 5\' 6"  (1.676 m)  General: Well developed, well nourished, in no acute distress  Skin : Warm and dry.  Head: Normocephalic and atraumatic  Eyes: Sclera and conjunctiva clear; pupils round and reactive to light; extraocular movements intact  Ears: External normal; canals clear; tympanic membranes normal  Oropharynx: Pink, supple. No suspicious lesions  Neck: Supple without thyromegaly, adenopathy  Lungs: Respirations unlabored; clear to auscultation bilaterally without wheeze, rales, rhonchi  CVS exam: normal rate and regular rhythm.  Neurologic: Alert and oriented; speech intact; face symmetrical; moves all extremities well; CNII-XII intact without focal deficit   Assessment:  1. Acute sinusitis, recurrence not specified, unspecified location     Plan:  Rx for Omnicef 300 mg bid x 10 days; continue OTC cough/ cold medication;  She feels that blood pressure is up due to being sick- she will continue to monitor and follow-up if consistently greater than 140/90.  No follow-ups on file.  No orders of the defined types were placed in this  encounter.   Requested Prescriptions   Signed Prescriptions Disp Refills  . cefdinir (OMNICEF) 300 MG capsule 20 capsule 0    Sig: Take 1 capsule (300 mg total) by mouth 2 (two) times daily.

## 2018-07-11 ENCOUNTER — Ambulatory Visit: Payer: Self-pay | Admitting: Podiatry

## 2018-07-18 ENCOUNTER — Encounter: Payer: Self-pay | Admitting: Podiatry

## 2018-07-18 ENCOUNTER — Ambulatory Visit (INDEPENDENT_AMBULATORY_CARE_PROVIDER_SITE_OTHER): Payer: Managed Care, Other (non HMO) | Admitting: Podiatry

## 2018-07-18 VITALS — BP 157/92 | HR 73 | Resp 16

## 2018-07-18 DIAGNOSIS — L600 Ingrowing nail: Secondary | ICD-10-CM

## 2018-07-18 DIAGNOSIS — B351 Tinea unguium: Secondary | ICD-10-CM | POA: Diagnosis not present

## 2018-07-18 NOTE — Progress Notes (Signed)
   Subjective:    Patient ID: Sheila Solomon, female    DOB: 10/01/1956, 62 y.o.   MRN: [id number]  HPI    Review of Systems  All other systems reviewed and are negative.      Objective:   Physical Exam        Assessment & Plan:

## 2018-07-18 NOTE — Progress Notes (Signed)
Subjective:   Patient ID: Sheila Solomon, female   DOB: 62 y.o.   MRN: [id number]   HPI Patient presents stating she is had a number of year history of discomfort with the right big toenail and its been thick and yellow and she does think she had an injury approximately 3 years ago.  Patient does not smoke likes to be active and is concerned about the colorization of this nail   Review of Systems  All other systems reviewed and are negative.       Objective:  Physical Exam  Constitutional: She appears well-developed and well-nourished.  Cardiovascular: Intact distal pulses.  Pulmonary/Chest: Effort normal.  Musculoskeletal: Normal range of motion.  Neurological: She is alert.  Skin: Skin is warm.  Nursing note and vitals reviewed.   Neurovascular status found to be intact muscle strength is adequate range of motion within normal limits with patient found to have a thickened right hallux nail that is localized with no proximal deformity with mild discomfort within the nailbed itself.  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  She can have the nail removed we could consider oral and laser therapy or just topical and after reviewing all the different options she is opted to just go with topical for now and if the pain were to increase we will consider a more aggressive treatment pattern.  Patient is given formula 7 and was given strict instructions to come in if any changes to the nailbed to occur

## 2018-07-18 NOTE — Patient Instructions (Signed)

## 2018-09-07 ENCOUNTER — Ambulatory Visit: Payer: Self-pay

## 2018-09-07 NOTE — Telephone Encounter (Signed)
Pt called seeking advice for irritated throat she describes as dry scratchy feeling. The dry throat started on Sunday. Pt denies fever and exposure to strep. She does have a cough which is sometimes productive. She has look at the back of her throat and feels that nothing is red but tonsils look large. Pt does not want an appointment but just advice for OTC care. Home care advice read to patient.  Pt verbalized understanding. Pt urged to call back if symptoms persist, a fever develops, or any other concerns.   Reason for Disposition . [1] Sore throat is the only symptom AND [2] sore throat present < 48 hours  Answer Assessment - Initial Assessment Questions 1. ONSET: "When did the throat start hurting?" (Hours or days ago)      Sunday 2. SEVERITY: "How bad is the sore throat?" (Scale 1-10; mild, moderate or severe)   - MILD (1-3):  doesn't interfere with eating or normal activities   - MODERATE (4-7): interferes with eating some solids and normal activities   - SEVERE (8-10):  excruciating pain, interferes with most normal activities   - SEVERE DYSPHAGIA: can't swallow liquids, drooling     mild 3. STREP EXPOSURE: "Has there been any exposure to strep within the past week?" If so, ask: "What type of contact occurred?"      no 4.  VIRAL SYMPTOMS: "Are there any symptoms of a cold, such as a runny nose, cough, hoarse voice or red eyes?"      cough 5. FEVER: "Do you have a fever?" If so, ask: "What is your temperature, how was it measured, and when did it start?"     no 6. PUS ON THE TONSILS: "Is there pus on the tonsils in the back of your throat?"     Inflamed  7. OTHER SYMPTOMS: "Do you have any other symptoms?" (e.g., difficulty breathing, headache, rash)     no 8. PREGNANCY: "Is there any chance you are pregnant?" "When was your last menstrual period?"     N/A  Protocols used: SORE THROAT-A-AH

## 2018-09-23 ENCOUNTER — Ambulatory Visit (INDEPENDENT_AMBULATORY_CARE_PROVIDER_SITE_OTHER): Payer: Managed Care, Other (non HMO) | Admitting: Internal Medicine

## 2018-09-23 ENCOUNTER — Encounter: Payer: Self-pay | Admitting: Internal Medicine

## 2018-09-23 VITALS — BP 144/96 | HR 86 | Temp 99.1°F | Resp 16 | Ht 66.0 in | Wt 193.8 lb

## 2018-09-23 DIAGNOSIS — I10 Essential (primary) hypertension: Secondary | ICD-10-CM | POA: Insufficient documentation

## 2018-09-23 DIAGNOSIS — R030 Elevated blood-pressure reading, without diagnosis of hypertension: Secondary | ICD-10-CM | POA: Diagnosis not present

## 2018-09-23 DIAGNOSIS — F419 Anxiety disorder, unspecified: Secondary | ICD-10-CM | POA: Diagnosis not present

## 2018-09-23 NOTE — Assessment & Plan Note (Addendum)
Around 113/78 at home prior to increased anxiety - elevated yesterday it was very high related to stress She will just monitor at home for now - likely once her stress/anxiety resolve her BP will return to normal at home.  It is typically elevated here Discussed stress management Call if blood pressure remains elevated at home

## 2018-09-23 NOTE — Progress Notes (Signed)
Subjective:    Patient ID: Sheila Solomon, female    DOB: 02-Aug-1956, 62 y.o.   MRN: [id number]  HPI The patient is here for an acute visit.   Anxiety:  She has had acutely increased anxiety.  It started two days ago. 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  No information was given - they were all just told to leave two days ago.  All of her symptoms started then.  She feels shaky, her BP is elevated, decreased appetite, chest pain/tightness, palpitations, diarrhea, headaches and she is having difficulty sleeping.    She has never had all these symptoms and was concerned that her BP was high.  It was not sure if the symptoms were related to stress or not.   Medications and allergies reviewed with patient and updated if appropriate.  Patient Active Problem List   Diagnosis Date Noted  . Obesity with body mass index 30 or greater 12/31/2017  . Prediabetes 12/31/2017  . Pain of toe of right foot 12/31/2017  . VITAMIN D DEFICIENCY 08/14/2010  . Osteopenia 11/12/2008  . HYPERLIPIDEMIA 11/14/2007    Current Outpatient Medications on File Prior to Visit  Medication Sig Dispense Refill  . Biotin 1000 MCG tablet Take 1,000 mcg by mouth as needed.    . calcium carbonate (OS-CAL) 600 MG TABS tablet Take 600 mg by mouth 2 (two) times daily with a meal.    . Cholecalciferol (VITAMIN D3) 2000 UNITS TABS Take 1 tablet by mouth daily.    Marland Kitchen Cod Liver Oil CAPS Take 1 capsule by mouth daily.    Marland Kitchen MAGNESIUM PO Take by mouth.    . Multiple Vitamin (MULTIVITAMIN) tablet Take 1 tablet by mouth daily.    Marland Kitchen POTASSIUM PO Take by mouth.    . vitamin C (ASCORBIC ACID) 500 MG tablet Take 500 mg by mouth as needed.     No current facility-administered medications on file prior to visit.     Past Medical History:  Diagnosis Date  . Cataract   . Hyperlipidemia   . Osteopenia    [facility]  . Vitamin D deficiency     Past  Surgical History:  Procedure Laterality Date  . CATARACT EXTRACTION     15 years ago  . COLONOSCOPY  2012    [facility];negative; due 2022  . G 1 P 1     C section  . TOTAL ABDOMINAL HYSTERECTOMY     fibroids & dysfunctional menses    Social History   Socioeconomic History  . Marital status: Divorced    Spouse name: Not on file  . Number of children: Not on file  . Years of education: Not on file  . Highest education level: Not on file  Occupational History  . Not on file  Social Needs  . Financial resource strain: Not on file  . Food insecurity:    Worry: Not on file    Inability: Not on file  . Transportation needs:    Medical: Not on file    Non-medical: Not on file  Tobacco Use  . Smoking status: Never Smoker  . Smokeless tobacco: Never Used  Substance and Sexual Activity  . Alcohol use: No  . Drug use: No  . Sexual activity: Not on file  Lifestyle  . Physical activity:    Days per week: Not on file    Minutes per session: Not on  file  . Stress: Not on file  Relationships  . Social connections:    Talks on phone: Not on file    Gets together: Not on file    Attends religious service: Not on file    Active member of club or organization: Not on file    Attends meetings of clubs or organizations: Not on file    Relationship status: Not on file  Other Topics Concern  . Not on file  Social History Narrative   Exercise: 1 day a week    Family History  Problem Relation Age of Onset  . Hypertension Mother   . Coronary artery disease Mother        4 v CBAG in 30s  . Hypertension Father   . Diabetes Father   . Other Father        elevated PSA  . Dementia Father   . Hypertension Sister   . Hypertension Brother   . Stroke Paternal Grandmother        in 72s  . Colon cancer Neg Hx   . Colon polyps Neg Hx   . Esophageal cancer Neg Hx   . Stomach cancer Neg Hx   . Rectal cancer Neg Hx     Review of Systems  Constitutional: Positive for appetite  change (decreased). Negative for fever.  HENT: Positive for sneezing.   Respiratory: Negative for cough, shortness of breath and wheezing.   Cardiovascular: Positive for chest pain and palpitations. Negative for leg swelling.  Gastrointestinal: Positive for diarrhea (stress related).  Neurological: Positive for tremors (feels shaky - anxiety related) and headaches (mild - stress  related). Negative for numbness.  Psychiatric/Behavioral: Positive for sleep disturbance. Negative for dysphoric mood. The patient is nervous/anxious.        Objective:   Vitals:   09/23/18 1501  BP: (!) 144/96  Pulse: 86  Resp: 16  Temp: 99.1 F (37.3 C)  SpO2: 97%   BP Readings from Last 3 Encounters:  09/23/18 (!) 144/96  07/18/18 (!) 157/92  04/08/18 (!) 150/100   Wt Readings from Last 3 Encounters:  09/23/18 193 lb 12.8 oz (87.9 kg)  04/08/18 192 lb 12 oz (87.4 kg)  02/18/18 198 lb (89.8 kg)   Body mass index is 31.28 kg/m.   Physical Exam    Constitutional: Appears well-developed and well-nourished. No distress.  HENT:  Head: Normocephalic and atraumatic.  Neck: Neck supple. No tracheal deviation present. No thyromegaly present.  No cervical lymphadenopathy Cardiovascular: Normal rate, regular rhythm and normal heart sounds.   No murmur heard. No carotid bruit .  No edema Pulmonary/Chest: Effort normal and breath sounds normal. No respiratory distress. No has no wheezes. No rales.  Skin: Skin is warm and dry. Not diaphoretic.  Psychiatric: mildly anxious mood and affect. Behavior is normal.       Assessment & Plan:    See Problem List for Assessment and Plan of chronic medical problems.

## 2018-09-23 NOTE — Patient Instructions (Signed)
Let me know if you want to start medication.

## 2018-09-24 NOTE — Assessment & Plan Note (Signed)
Acute increase in stress 2 days ago secondary to work situation Discussed with her that all of her symptoms she is experiencing are related to stress/anxiety Hopefully the work situation will be resolved soon, which will solve the problem, but she is looking for another job just in case She is already started to work on some stress relief-meditation, breathing techniques and she will go to the gym tomorrow Discussed regular exercise Discussed talking to a friend or can refer to a therapist if situation is prolonged Discussed medication-both for situational anxiety and daily medications-she deferred for now Discussed essential oils She will call if there is no improvement or if she wants to consider medication

## 2018-09-27 ENCOUNTER — Ambulatory Visit (INDEPENDENT_AMBULATORY_CARE_PROVIDER_SITE_OTHER): Payer: Managed Care, Other (non HMO) | Admitting: Sports Medicine

## 2018-09-27 ENCOUNTER — Encounter: Payer: Self-pay | Admitting: Sports Medicine

## 2018-09-27 DIAGNOSIS — L601 Onycholysis: Secondary | ICD-10-CM | POA: Diagnosis not present

## 2018-09-27 DIAGNOSIS — M79674 Pain in right toe(s): Secondary | ICD-10-CM

## 2018-09-27 DIAGNOSIS — B351 Tinea unguium: Secondary | ICD-10-CM | POA: Diagnosis not present

## 2018-09-27 NOTE — Progress Notes (Signed)
Subjective: Sheila Solomon is a 62 y.o. female patient seen today in office with complaint of mildly painful thickened and discolored right first toenail. Patient is desiring treatment for nail changes; has tried OTC topicals/Medication in the past with no improvement however does admit that she has not been doing this consistently with the topical over-the-counter fungal nail treatment or Naftin as prescribed by another foot doctor. Reports she is concerned because she has waited so long to have it treated and since her last visit nothing was done for the nail and she wanted it to be reassessed by different provider because she is concerning that the end of the nail is lifted more.  Denies nausea vomiting fever chills redness warmth drainage or any acute symptoms at the right first toe.  Patient has no other pedal complaints at this time.   Patient Active Problem List   Diagnosis Date Noted  . Anxiety 09/23/2018  . Elevated blood pressure reading 09/23/2018  . Obesity with body mass index 30 or greater 12/31/2017  . Prediabetes 12/31/2017  . Pain of toe of right foot 12/31/2017  . VITAMIN D DEFICIENCY 08/14/2010  . Osteopenia 11/12/2008  . HYPERLIPIDEMIA 11/14/2007    Current Outpatient Medications on File Prior to Visit  Medication Sig Dispense Refill  . Biotin 1000 MCG tablet Take 1,000 mcg by mouth as needed.    . calcium carbonate (OS-CAL) 600 MG TABS tablet Take 600 mg by mouth 2 (two) times daily with a meal.    . Cholecalciferol (VITAMIN D3) 2000 UNITS TABS Take 1 tablet by mouth daily.    Marland Kitchen Cod Liver Oil CAPS Take 1 capsule by mouth daily.    Marland Kitchen MAGNESIUM PO Take by mouth.    . Multiple Vitamin (MULTIVITAMIN) tablet Take 1 tablet by mouth daily.    Marland Kitchen POTASSIUM PO Take by mouth.    . vitamin C (ASCORBIC ACID) 500 MG tablet Take 500 mg by mouth as needed.     No current facility-administered medications on file prior to visit.     Allergies  Allergen Reactions  . Prednisone     REACTION: rash & facial swelling. The prednisone was prescribed for rash related to seafood ingestion; but she developed facial swelling and hives after taking the prednisone. This was not facial edema related to long-term prednisone or an acneiform rash with prednisone.    Objective: Physical Exam  General: Well developed, nourished, no acute distress, awake, alert and oriented x 3  Vascular: Dorsalis pedis artery 2/4 bilateral, Posterior tibial artery 2/4 bilateral, skin temperature warm to warm proximal to distal bilateral lower extremities, no varicosities, pedal hair present bilateral.  Neurological: Gross sensation present via light touch bilateral.   Dermatological: Skin is warm, dry, and supple bilateral, right hallux nail is thickened with subungual debris consistent with onychomycosis there is distal lifting of the nail however the proximal nail is well attached to the nailbed with no changes consistent of fungus with possible secondary finding of microtrauma.  All other nails are within normal limits no webspace macerations present bilateral, no open lesions present bilateral, no callus/corns/hyperkeratotic tissue present bilateral. No signs of infection bilateral.  Musculoskeletal: No symptomatic boney deformities noted bilateral. Muscular strength within normal limits without painon range of motion. No pain with calf compression bilateral.  Assessment and Plan:  Problem List Items Addressed This Visit    None    Visit Diagnoses    Onycholysis    -  Primary   Dermatophytosis of nail  Toe pain, right         -Examined patient -Discussed treatment options for painful dystrophic right great toenail -Patient declines nail procedure at this visit and to continue with topical over-the-counter treatments I advised patient that there is a small chance that this treatment will help at all and advised patient with any topicals may take a year to work and she must be consistent  with doing this otherwise would recommend her to have either her nail removed or to have her nail further tested to be put on oral medication or consider laser patient does not want to consider a costly route at this time and desires to continue with topical treatment -Encouraged good hygiene habits and to keep the nails trimmed and filed so that way it does not go out too long and lift up and become an issue or problem with injury or traumatic self avulsion: Patient expressed understanding -Patient to return as needed or sooner if symptoms worsen.  Landis Martins, DPM

## 2019-05-05 ENCOUNTER — Other Ambulatory Visit: Payer: Self-pay

## 2019-05-05 ENCOUNTER — Encounter: Payer: Self-pay | Admitting: Sports Medicine

## 2019-05-05 ENCOUNTER — Ambulatory Visit: Payer: 59 | Admitting: Sports Medicine

## 2019-05-05 DIAGNOSIS — L601 Onycholysis: Secondary | ICD-10-CM | POA: Diagnosis not present

## 2019-05-05 DIAGNOSIS — M79674 Pain in right toe(s): Secondary | ICD-10-CM

## 2019-05-05 DIAGNOSIS — B351 Tinea unguium: Secondary | ICD-10-CM | POA: Diagnosis not present

## 2019-05-05 NOTE — Progress Notes (Signed)
Subjective: Sheila Solomon is a 63 y.o. female patient returns to the office with complaint of mildly painful thickened and discolored right first toenail. Patient reports that she has been using Naftin to the area with no improvement states that the nail is continuing to grow out thick and sticking up high.  Patient denies redness warmth swelling nausea vomiting fever or chills and wants to further discuss treatment options for her right great toenail.  Patient has no other pedal complaints at this time.   Patient Active Problem List   Diagnosis Date Noted  . Anxiety 09/23/2018  . Elevated blood pressure reading 09/23/2018  . Obesity with body mass index 30 or greater 12/31/2017  . Prediabetes 12/31/2017  . Pain of toe of right foot 12/31/2017  . VITAMIN D DEFICIENCY 08/14/2010  . Osteopenia 11/12/2008  . HYPERLIPIDEMIA 11/14/2007    Current Outpatient Medications on File Prior to Visit  Medication Sig Dispense Refill  . Biotin 1000 MCG tablet Take 1,000 mcg by mouth as needed.    . calcium carbonate (OS-CAL) 600 MG TABS tablet Take 600 mg by mouth 2 (two) times daily with a meal.    . Cholecalciferol (VITAMIN D3) 2000 UNITS TABS Take 1 tablet by mouth daily.    Marland Kitchen Cod Liver Oil CAPS Take 1 capsule by mouth daily.    Marland Kitchen MAGNESIUM PO Take by mouth.    . Multiple Vitamin (MULTIVITAMIN) tablet Take 1 tablet by mouth daily.    Marland Kitchen POTASSIUM PO Take by mouth.    . vitamin C (ASCORBIC ACID) 500 MG tablet Take 500 mg by mouth as needed.     No current facility-administered medications on file prior to visit.     Allergies  Allergen Reactions  . Prednisone     REACTION: rash & facial swelling. The prednisone was prescribed for rash related to seafood ingestion; but she developed facial swelling and hives after taking the prednisone. This was not facial edema related to long-term prednisone or an acneiform rash with prednisone.    Objective: Physical Exam  General: Well developed,  nourished, no acute distress, awake, alert and oriented x 3  Vascular: Dorsalis pedis artery 2/4 bilateral, Posterior tibial artery 2/4 bilateral, skin temperature warm to warm proximal to distal bilateral lower extremities, no varicosities, pedal hair present bilateral.  Neurological: Gross sensation present via light touch bilateral.   Dermatological: Skin is warm, dry, and supple bilateral, right hallux nail is thickened with subungual debris consistent with onychomycosis there is distal lifting of the nail however the proximal nail is well attached to the nailbed with no changes consistent of fungus with possible secondary finding of microtrauma with old history of dropping something on her toe.  All other nails are within normal limits no webspace macerations present bilateral, no open lesions present bilateral, no callus/corns/hyperkeratotic tissue present bilateral. No signs of infection bilateral.  Musculoskeletal: No symptomatic boney deformities noted bilateral. Muscular strength within normal limits without painon range of motion. No pain with calf compression bilateral.  Assessment and Plan:  Problem List Items Addressed This Visit    None    Visit Diagnoses    Dermatophytosis of nail    -  Primary   Onycholysis       Relevant Orders   Culture, fungus without smear   Toe pain, right         -Examined patient -Discussed treatment options for painful dystrophic right great toenail -Fungal culture obtained by mechanically debriding the distal nail plate  and underlying subungual tissue with a sterile nail nipper at the right great toenail and sent the specimen to [facility] pathology for fungal culture -Encouraged good hygiene habits -Recommend good supportive shoes that do not rub or irritate toe -Patient to return in 4 weeks for discussion of fungal culture results or sooner if symptoms worsen.  Landis Martins, DPM

## 2019-05-30 ENCOUNTER — Telehealth: Payer: Self-pay | Admitting: Sports Medicine

## 2019-05-30 NOTE — Telephone Encounter (Signed)
Patient would like to know if her 06/02/2019 appt can be changed to a virtual visit since its just for test result review. Please call if this is possible.

## 2019-06-01 NOTE — Telephone Encounter (Signed)
Dr Kingsley Callander has fixed this to a virtual call tomorrow info in appt notes

## 2019-06-02 ENCOUNTER — Telehealth (INDEPENDENT_AMBULATORY_CARE_PROVIDER_SITE_OTHER): Payer: 59 | Admitting: Sports Medicine

## 2019-06-02 DIAGNOSIS — L601 Onycholysis: Secondary | ICD-10-CM

## 2019-06-02 DIAGNOSIS — M79674 Pain in right toe(s): Secondary | ICD-10-CM

## 2019-06-02 DIAGNOSIS — B351 Tinea unguium: Secondary | ICD-10-CM

## 2019-06-02 NOTE — Progress Notes (Signed)
Patient was contacted via the phone number provided for a telephone visit.  Patient did not answer the phone I left a voicemail informing patient that we still have not received the pathology results from her nail sample we have contacted the lab and the lab is reporting that they are still processing the specimen and it likely was delayed in processing due to shipping.  I advised patient via the voicemail message that we will call her back once we have the culture results and will discuss with her at that time to decide the treatment and course of care.  Office callback number was provided on patient's voicemail. -Dr. Cannon Kettle

## 2019-06-08 ENCOUNTER — Telehealth: Payer: Self-pay | Admitting: Sports Medicine

## 2019-06-08 NOTE — Telephone Encounter (Signed)
I called patient to discuss with her [facility] fungal culture results.  Patient did not answer.  I left a voicemail detailing to patient that her fungal culture shows traumatic changes to her nail as well as fungus. I left on her voicemail the option of oral Lamisil or laser treatment or a topical antifungal compound nail lacquer.  I advised patient to think about these options and to call office back once she has decided on which treatment option she prefers to treat the fungus that is in her toenail.  Office callback number provided. -Dr. Cannon Kettle

## 2019-06-09 NOTE — Telephone Encounter (Signed)
Left message informing pt Dr. Cannon Kettle had reviewed the testing results and to call to discuss results and treatment with Gretta Arab, RN.

## 2019-06-13 NOTE — Telephone Encounter (Signed)
LVM for patient to return my call to discuss treatment options and results

## 2019-06-20 ENCOUNTER — Ambulatory Visit (INDEPENDENT_AMBULATORY_CARE_PROVIDER_SITE_OTHER): Payer: 59 | Admitting: Sports Medicine

## 2019-06-20 ENCOUNTER — Other Ambulatory Visit: Payer: Self-pay

## 2019-06-20 ENCOUNTER — Encounter: Payer: Self-pay | Admitting: Sports Medicine

## 2019-06-20 VITALS — Temp 97.1°F

## 2019-06-20 DIAGNOSIS — B351 Tinea unguium: Secondary | ICD-10-CM

## 2019-06-20 DIAGNOSIS — B359 Dermatophytosis, unspecified: Secondary | ICD-10-CM | POA: Diagnosis not present

## 2019-06-20 DIAGNOSIS — M79674 Pain in right toe(s): Secondary | ICD-10-CM | POA: Diagnosis not present

## 2019-06-20 MED ORDER — NYSTATIN-TRIAMCINOLONE 100000-0.1 UNIT/GM-% EX OINT: 1.0000 "application " | TOPICAL_OINTMENT | Freq: Two times a day (BID) | CUTANEOUS | 0 refills | Status: DC

## 2019-06-20 MED ORDER — CLOTRIMAZOLE 1 % EX SOLN: 1.0000 "application " | Freq: Two times a day (BID) | CUTANEOUS | 5 refills | Status: DC

## 2019-06-20 NOTE — Progress Notes (Signed)
Subjective: Sheila Solomon is a 63 y.o. female patient seen today in office for fungal culture results.  Patient also admits that she is working for [employer] and does a lot of standing and walking and has noticed that she has a little bit more swelling in both her ankles.  Patient has no other pedal complaints at this time.   Patient Active Problem List   Diagnosis Date Noted  . Anxiety 09/23/2018  . Elevated blood pressure reading 09/23/2018  . Obesity with body mass index 30 or greater 12/31/2017  . Prediabetes 12/31/2017  . Pain of toe of right foot 12/31/2017  . VITAMIN D DEFICIENCY 08/14/2010  . Osteopenia 11/12/2008  . HYPERLIPIDEMIA 11/14/2007    Current Outpatient Medications on File Prior to Visit  Medication Sig Dispense Refill  . Biotin 1000 MCG tablet Take 1,000 mcg by mouth as needed.    . calcium carbonate (OS-CAL) 600 MG TABS tablet Take 600 mg by mouth 2 (two) times daily with a meal.    . Cholecalciferol (VITAMIN D3) 2000 UNITS TABS Take 1 tablet by mouth daily.    Marland Kitchen Cod Liver Oil CAPS Take 1 capsule by mouth daily.    Marland Kitchen MAGNESIUM PO Take by mouth.    . Multiple Vitamin (MULTIVITAMIN) tablet Take 1 tablet by mouth daily.    Marland Kitchen POTASSIUM PO Take by mouth.    . vitamin C (ASCORBIC ACID) 500 MG tablet Take 500 mg by mouth as needed.     No current facility-administered medications on file prior to visit.     Allergies  Allergen Reactions  . Prednisone     REACTION: rash & facial swelling. The prednisone was prescribed for rash related to seafood ingestion; but she developed facial swelling and hives after taking the prednisone. This was not facial edema related to long-term prednisone or an acneiform rash with prednisone.    Objective: Physical Exam  General: Well developed, nourished, no acute distress, awake, alert and oriented x 3  Vascular: Dorsalis pedis artery 2/4 bilateral, Posterior tibial artery 1/4 bilateral, skin temperature warm to warm proximal to  distal bilateral lower extremities, trace edema bilateral, no varicosities, pedal hair present bilateral.  Neurological: Gross sensation present via light touch bilateral.   Dermatological: Skin is warm, dry, and supple bilateral, right hallux toenail is short thick and discolored with subungual debris, there is webspace macerations present bilateral right greater than left with dry peeling skin consistent with tinea, no open lesions present bilateral, no callus/corns/hyperkeratotic tissue present bilateral. No signs of infection bilateral.  Musculoskeletal: No symptomatic boney deformities noted bilateral. Muscular strength within normal limits without painon range of motion. No pain with calf compression bilateral.  Fungal culture consistent with fungus and microtrauma  Assessment and Plan:  Problem List Items Addressed This Visit    None    Visit Diagnoses    Tinea    -  Primary   Relevant Medications   clotrimazole (LOTRIMIN) 1 % external solution   nystatin-triamcinolone ointment (MYCOLOG)   Nail fungus       Relevant Medications   clotrimazole (LOTRIMIN) 1 % external solution   nystatin-triamcinolone ointment (MYCOLOG)   Other Relevant Orders   Hepatic Function Panel   Toe pain, right          -Examined patient -Discussed treatment options for painful mycotic nails -Patient opt for oral Lamisil with full understanding of medication risks; ordered LFTs for review if within normal limits will proceed with sending Rx to pharmacy  for lamisil 250mg  PO daily.  Anticipate a 12-week course. -Patient opt for topical treatment of skin changes prescribed nystatin cream to use twice daily and clotrimazole solution to use in between toes -Advised good hygiene habits and to dry well in between toes -Advised over-the-counter compression garments for swelling bilateral -Patient to return in 6 weeks for follow up evaluation or sooner if symptoms worsen.  Landis Martins, DPM

## 2019-06-21 LAB — HEPATIC FUNCTION PANEL
AG Ratio: 1.2 (calc) (ref 1.0–2.5)
ALT: 15 U/L (ref 6–29)
AST: 18 U/L (ref 10–35)
Albumin: 4.1 g/dL (ref 3.6–5.1)
Alkaline phosphatase (APISO): 47 U/L (ref 37–153)
Bilirubin, Direct: 0.1 mg/dL (ref 0.0–0.2)
Globulin: 3.3 g/dL (calc) (ref 1.9–3.7)
Indirect Bilirubin: 0.5 mg/dL (calc) (ref 0.2–1.2)
Total Bilirubin: 0.6 mg/dL (ref 0.2–1.2)
Total Protein: 7.4 g/dL (ref 6.1–8.1)

## 2019-06-22 ENCOUNTER — Telehealth: Payer: Self-pay | Admitting: *Deleted

## 2019-06-22 MED ORDER — TERBINAFINE HCL 250 MG PO TABS: 250.0000 mg | ORAL_TABLET | Freq: Every day | ORAL | 0 refills | Status: DC

## 2019-06-22 NOTE — Telephone Encounter (Signed)
Left message informing pt of Dr. Leeanne Rio review of results and that her medication had been sent to the [id number].

## 2019-06-22 NOTE — Telephone Encounter (Signed)
-----   Message from Landis Martins, [place] sent at 06/21/2019  7:24 AM EDT ----- Will you let patient know that her Liver panel is normal and send Lamisil 250mg  take one tab daily disp 90 Thanks Dr. Cannon Kettle

## 2019-08-01 ENCOUNTER — Other Ambulatory Visit: Payer: Self-pay

## 2019-08-01 ENCOUNTER — Encounter: Payer: Self-pay | Admitting: Sports Medicine

## 2019-08-01 ENCOUNTER — Ambulatory Visit: Payer: 59 | Admitting: Sports Medicine

## 2019-08-01 DIAGNOSIS — M79674 Pain in right toe(s): Secondary | ICD-10-CM

## 2019-08-01 DIAGNOSIS — B359 Dermatophytosis, unspecified: Secondary | ICD-10-CM | POA: Diagnosis not present

## 2019-08-01 DIAGNOSIS — B351 Tinea unguium: Secondary | ICD-10-CM | POA: Diagnosis not present

## 2019-08-01 NOTE — Progress Notes (Signed)
Subjective: Sheila Solomon is a 63 y.o. female patient seen today in office for follow up evaluation of nail fungus on Lamisil and using topical, reports that it is slowly getting better with the skin rash gone. Patient states that he/she is doing well with no adverse reaction. Patient has no other pedal complaints at this time.   Patient Active Problem List   Diagnosis Date Noted  . Anxiety 09/23/2018  . Elevated blood pressure reading 09/23/2018  . Obesity with body mass index 30 or greater 12/31/2017  . Prediabetes 12/31/2017  . Pain of toe of right foot 12/31/2017  . VITAMIN D DEFICIENCY 08/14/2010  . Osteopenia 11/12/2008  . HYPERLIPIDEMIA 11/14/2007    Current Outpatient Medications on File Prior to Visit  Medication Sig Dispense Refill  . Biotin 1000 MCG tablet Take 1,000 mcg by mouth as needed.    . calcium carbonate (OS-CAL) 600 MG TABS tablet Take 600 mg by mouth 2 (two) times daily with a meal.    . Cholecalciferol (VITAMIN D3) 2000 UNITS TABS Take 1 tablet by mouth daily.    . clotrimazole (LOTRIMIN) 1 % external solution Apply 1 application topically 2 (two) times daily. In between toes 60 mL 5  . Cod Liver Oil CAPS Take 1 capsule by mouth daily.    Marland Kitchen MAGNESIUM PO Take by mouth.    . Multiple Vitamin (MULTIVITAMIN) tablet Take 1 tablet by mouth daily.    Marland Kitchen nystatin-triamcinolone ointment (MYCOLOG) Apply 1 application topically 2 (two) times daily. 30 g 0  . POTASSIUM PO Take by mouth.    . terbinafine (LAMISIL) 250 MG tablet Take 1 tablet (250 mg total) by mouth daily. 90 tablet 0  . vitamin C (ASCORBIC ACID) 500 MG tablet Take 500 mg by mouth as needed.     No current facility-administered medications on file prior to visit.     Allergies  Allergen Reactions  . Prednisone     REACTION: rash & facial swelling. The prednisone was prescribed for rash related to seafood ingestion; but she developed facial swelling and hives after taking the prednisone. This was not  facial edema related to long-term prednisone or an acneiform rash with prednisone.    Objective: Physical Exam  General: Well developed, nourished, no acute distress, awake, alert and oriented x 3  Vascular: Dorsalis pedis artery 2/4 bilateral, Posterior tibial artery 1/4 bilateral, skin temperature warm to warm proximal to distal bilateral lower extremities, no varicosities, pedal hair present bilateral.  Neurological: Gross sensation present via light touch bilateral.   Dermatological: Skin is warm, dry, and supple bilateral, Right hallux nail and discolored with mild subungal debris and early clearance noted at proximal nail bed, no webspace macerations present bilateral, no open lesions present bilateral, no callus/corns/hyperkeratotic tissue present bilateral. No signs of infection bilateral.  Musculoskeletal: No symptomatic boney deformities noted bilateral. Muscular strength within normal limits without painon range of motion. No pain with calf compression bilateral.  Assessment and Plan:  Problem List Items Addressed This Visit    None    Visit Diagnoses    Nail fungus    -  Primary   Relevant Orders   Hepatic Function Panel   Tinea       Toe pain, right          -Examined patient -Cont with Lamisil; a new set of LFTs were ordered; will call patient to stop medication if abnormal  -Advised good hygiene habits and educated patient on proper foot care to  prevent re-infection -Continue with topical clotrimazole and nystatin  -Patient to return in 6 weeks for follow up evaluation or sooner if symptoms worsen.  Landis Martins, DPM

## 2019-09-07 NOTE — Patient Instructions (Addendum)
BP less than 140/90 on average  Tests ordered today. Your results will be released to [place] (or called to you) after review.  If any changes need to be made, you will be notified at that same time.  All other Health Maintenance issues reviewed.   All recommended immunizations and age-appropriate screenings are up-to-date or discussed.  No immunization administered today.   Medications reviewed and updated.  Changes include :   none    Please followup in 1 year    Health Maintenance, Female Adopting a healthy lifestyle and getting preventive care are important in promoting health and wellness. Ask your health care provider about:  The right schedule for you to have regular tests and exams.  Things you can do on your own to prevent diseases and keep yourself healthy. What should I know about diet, weight, and exercise? Eat a healthy diet   Eat a diet that includes plenty of vegetables, fruits, low-fat dairy products, and lean protein.  Do not eat a lot of foods that are high in solid fats, added sugars, or sodium. Maintain a healthy weight Body mass index (BMI) is used to identify weight problems. It estimates body fat based on height and weight. Your health care provider can help determine your BMI and help you achieve or maintain a healthy weight. Get regular exercise Get regular exercise. This is one of the most important things you can do for your health. Most adults should:  Exercise for at least 150 minutes each week. The exercise should increase your heart rate and make you sweat (moderate-intensity exercise).  Do strengthening exercises at least twice a week. This is in addition to the moderate-intensity exercise.  Spend less time sitting. Even light physical activity can be beneficial. Watch cholesterol and blood lipids Have your blood tested for lipids and cholesterol at 65 years of age, then have this test every 5 years. Have your cholesterol levels checked more  often if:  Your lipid or cholesterol levels are high.  You are older than 65 years of age.  You are at high risk for heart disease. What should I know about cancer screening? Depending on your health history and family history, you may need to have cancer screening at various ages. This may include screening for:  Breast cancer.  Cervical cancer.  Colorectal cancer.  Skin cancer.  Lung cancer. What should I know about heart disease, diabetes, and high blood pressure? Blood pressure and heart disease  High blood pressure causes heart disease and increases the risk of stroke. This is more likely to develop in people who have high blood pressure readings, are of African descent, or are overweight.  Have your blood pressure checked: ? Every 3-5 years if you are 40-44 years of age. ? Every year if you are 15 years old or older. Diabetes Have regular diabetes screenings. This checks your fasting blood sugar level. Have the screening done:  Once every three years after age 28 if you are at a normal weight and have a low risk for diabetes.  More often and at a younger age if you are overweight or have a high risk for diabetes. What should I know about preventing infection? Hepatitis B If you have a higher risk for hepatitis B, you should be screened for this virus. Talk with your health care provider to find out if you are at risk for hepatitis B infection. Hepatitis C Testing is recommended for:  Everyone born from [birth year] through 1967.  Anyone  with known risk factors for hepatitis C. Sexually transmitted infections (STIs)  Get screened for STIs, including gonorrhea and chlamydia, if: ? You are sexually active and are younger than 65 years of age. ? You are older than 65 years of age and your health care provider tells you that you are at risk for this type of infection. ? Your sexual activity has changed since you were last screened, and you are at increased risk for chlamydia  or gonorrhea. Ask your health care provider if you are at risk.  Ask your health care provider about whether you are at high risk for HIV. Your health care provider may recommend a prescription medicine to help prevent HIV infection. If you choose to take medicine to prevent HIV, you should first get tested for HIV. You should then be tested every 3 months for as long as you are taking the medicine. Pregnancy  If you are about to stop having your period (premenopausal) and you may become pregnant, seek counseling before you get pregnant.  Take 400 to 800 micrograms (mcg) of folic acid every day if you become pregnant.  Ask for birth control (contraception) if you want to prevent pregnancy. Osteoporosis and menopause Osteoporosis is a disease in which the bones lose minerals and strength with aging. This can result in bone fractures. If you are 49 years old or older, or if you are at risk for osteoporosis and fractures, ask your health care provider if you should:  Be screened for bone loss.  Take a calcium or vitamin D supplement to lower your risk of fractures.  Be given hormone replacement therapy (HRT) to treat symptoms of menopause. Follow these instructions at home: Lifestyle  Do not use any products that contain nicotine or tobacco, such as cigarettes, e-cigarettes, and chewing tobacco. If you need help quitting, ask your health care provider.  Do not use street drugs.  Do not share needles.  Ask your health care provider for help if you need support or information about quitting drugs. Alcohol use  Do not drink alcohol if: ? Your health care provider tells you not to drink. ? You are pregnant, may be pregnant, or are planning to become pregnant.  If you drink alcohol: ? Limit how much you use to 0-1 drink a day. ? Limit intake if you are breastfeeding.  Be aware of how much alcohol is in your drink. In the [place] one drink equals one 12 oz bottle of beer (355 mL), one 5 oz  glass of wine (148 mL), or one 1 oz glass of hard liquor (44 mL). General instructions  Schedule regular health, dental, and eye exams.  Stay current with your vaccines.  Tell your health care provider if: ? You often feel depressed. ? You have ever been abused or do not feel safe at home. Summary  Adopting a healthy lifestyle and getting preventive care are important in promoting health and wellness.  Follow your health care provider's instructions about healthy diet, exercising, and getting tested or screened for diseases.  Follow your health care provider's instructions on monitoring your cholesterol and blood pressure. This information is not intended to replace advice given to you by your health care provider. Make sure you discuss any questions you have with your health care provider. Document Released: 05/25/2011 Document Revised: 11/02/2018 Document Reviewed: 11/02/2018 Elsevier Patient Education  2020 [employer].

## 2019-09-07 NOTE — Progress Notes (Signed)
Subjective:    Patient ID: Sheila Solomon, female    DOB: [birth year], 63 y.o.   MRN: [id number]  HPI She is here for a physical exam.   Since she was here last she has gotten a new job and is doing well.  She likes her new job.  Of course there is some stress, but she feels it is good stress.  She has not been exercising regularly and her weight has gone up.  She knows she needs to get back into a good regimen and yesterday she did walk a mile.  She has been a regular walker in the past.  Medications and allergies reviewed with patient and updated if appropriate.  Patient Active Problem List   Diagnosis Date Noted  . Anxiety 09/23/2018  . Elevated blood pressure reading 09/23/2018  . Obesity with body mass index 30 or greater 12/31/2017  . Prediabetes 12/31/2017  . Pain of toe of right foot 12/31/2017  . VITAMIN D DEFICIENCY 08/14/2010  . Osteopenia 11/12/2008  . HYPERLIPIDEMIA 11/14/2007    Current Outpatient Medications on File Prior to Visit  Medication Sig Dispense Refill  . Biotin 1000 MCG tablet Take 1,000 mcg by mouth as needed.    . calcium carbonate (OS-CAL) 600 MG TABS tablet Take 600 mg by mouth 2 (two) times daily with a meal.    . Cholecalciferol (VITAMIN D3) 2000 UNITS TABS Take 1 tablet by mouth daily.    . clotrimazole (LOTRIMIN) 1 % external solution Apply 1 application topically 2 (two) times daily. In between toes 60 mL 5  . Cod Liver Oil CAPS Take 1 capsule by mouth daily.    Marland Kitchen MAGNESIUM PO Take by mouth.    . Multiple Vitamin (MULTIVITAMIN) tablet Take 1 tablet by mouth daily.    Marland Kitchen nystatin-triamcinolone ointment (MYCOLOG) Apply 1 application topically 2 (two) times daily. 30 g 0  . POTASSIUM PO Take by mouth.    . terbinafine (LAMISIL) 250 MG tablet Take 1 tablet (250 mg total) by mouth daily. 90 tablet 0  . vitamin C (ASCORBIC ACID) 500 MG tablet Take 500 mg by mouth as needed.     No current facility-administered medications on file prior to visit.      Past Medical History:  Diagnosis Date  . Cataract   . Hyperlipidemia   . Osteopenia    [facility]  . Vitamin D deficiency     Past Surgical History:  Procedure Laterality Date  . CATARACT EXTRACTION     15 years ago  . COLONOSCOPY  2012    [facility];negative; due 2022  . G 1 P 1     C section  . TOTAL ABDOMINAL HYSTERECTOMY     fibroids & dysfunctional menses    Social History   Socioeconomic History  . Marital status: Divorced    Spouse name: Not on file  . Number of children: Not on file  . Years of education: Not on file  . Highest education level: Not on file  Occupational History  . Not on file  Social Needs  . Financial resource strain: Not on file  . Food insecurity    Worry: Not on file    Inability: Not on file  . Transportation needs    Medical: Not on file    Non-medical: Not on file  Tobacco Use  . Smoking status: Never Smoker  . Smokeless tobacco: Never Used  Substance and Sexual Activity  . Alcohol use:  No  . Drug use: No  . Sexual activity: Not on file  Lifestyle  . Physical activity    Days per week: Not on file    Minutes per session: Not on file  . Stress: Not on file  Relationships  . Social Herbalist on phone: Not on file    Gets together: Not on file    Attends religious service: Not on file    Active member of club or organization: Not on file    Attends meetings of clubs or organizations: Not on file    Relationship status: Not on file  Other Topics Concern  . Not on file  Social History Narrative   Exercise: 1 day a week    Family History  Problem Relation Age of Onset  . Hypertension Mother   . Coronary artery disease Mother        4 v CBAG in 59s  . Hypertension Father   . Diabetes Father   . Other Father        elevated PSA  . Dementia Father   . Hypertension Sister   . Hypertension Brother   . Stroke Paternal Grandmother        in 73s  . Colon cancer Neg Hx   . Colon polyps Neg Hx   .  Esophageal cancer Neg Hx   . Stomach cancer Neg Hx   . Rectal cancer Neg Hx     Review of Systems  Constitutional: Negative for chills and fever.  Eyes: Negative for visual disturbance.  Respiratory: Negative for cough, shortness of breath and wheezing.   Cardiovascular: Positive for leg swelling (occ). Negative for chest pain and palpitations.  Gastrointestinal: Negative for abdominal pain, blood in stool, constipation, diarrhea and nausea.       No gerd  Genitourinary: Negative for dysuria and hematuria.  Musculoskeletal: Negative for arthralgias and back pain.  Skin: Negative for color change and rash.  Neurological: Negative for light-headedness and headaches.  Psychiatric/Behavioral: Negative for dysphoric mood. The patient is not nervous/anxious.        Objective:   Vitals:   09/08/19 0915  BP: (!) 154/102  Pulse: 81  Resp: 16  Temp: 98.9 F (37.2 C)  SpO2: 98%   Filed Weights   09/08/19 0915  Weight: 203 lb (92.1 kg)   Body mass index is 32.77 kg/m.  BP Readings from Last 3 Encounters:  09/08/19 (!) 154/102  09/23/18 (!) 144/96  07/18/18 (!) 157/92    Wt Readings from Last 3 Encounters:  09/08/19 203 lb (92.1 kg)  09/23/18 193 lb 12.8 oz (87.9 kg)  04/08/18 192 lb 12 oz (87.4 kg)     Physical Exam Constitutional: She appears well-developed and well-nourished. No distress.  HENT:  Head: Normocephalic and atraumatic.  Right Ear: External ear normal. Normal ear canal and TM Left Ear: External ear normal.  Normal ear canal and TM Mouth/Throat: Oropharynx is clear and moist.  Eyes: Conjunctivae and EOM are normal.  Neck: Neck supple. No tracheal deviation present. No thyromegaly present.  No carotid bruit  Cardiovascular: Normal rate, regular rhythm and normal heart sounds.   No murmur heard.  No edema. Pulmonary/Chest: Effort normal and breath sounds normal. No respiratory distress. She has no wheezes. She has no rales.  Breast: deferred   Abdominal:  Soft. She exhibits no distension. There is no tenderness.  Lymphadenopathy: She has no cervical adenopathy.  Skin: Skin is warm and dry. She is not  diaphoretic.  Psychiatric: She has a normal mood and affect. Her behavior is normal.        Assessment & Plan:   Physical exam: Screening blood work    ordered Immunizations  Flu vaccine at work,  prevnar deferred, tdap due, discussed shingrix Colonoscopy  Up to date  Mammogram   Scheduled - dr Raphael Gibney Gyn  scheduled Dexa  Up to date  Eye exams  Up to date  - Dr Gershon Crane Exercise  None, just started to walk Weight  Discussed weight loss Substance abuse  none  See Problem List for Assessment and Plan of chronic medical problems.    Follow-up in 1 year, sooner if blood pressure is elevated at home

## 2019-09-08 ENCOUNTER — Other Ambulatory Visit: Payer: Self-pay

## 2019-09-08 ENCOUNTER — Ambulatory Visit (INDEPENDENT_AMBULATORY_CARE_PROVIDER_SITE_OTHER): Payer: 59 | Admitting: Internal Medicine

## 2019-09-08 ENCOUNTER — Encounter: Payer: Self-pay | Admitting: Internal Medicine

## 2019-09-08 ENCOUNTER — Other Ambulatory Visit (INDEPENDENT_AMBULATORY_CARE_PROVIDER_SITE_OTHER): Payer: 59

## 2019-09-08 VITALS — BP 154/102 | HR 81 | Temp 98.9°F | Resp 16 | Ht 66.0 in | Wt 203.0 lb

## 2019-09-08 DIAGNOSIS — Z0000 Encounter for general adult medical examination without abnormal findings: Secondary | ICD-10-CM

## 2019-09-08 DIAGNOSIS — R7303 Prediabetes: Secondary | ICD-10-CM

## 2019-09-08 DIAGNOSIS — M85861 Other specified disorders of bone density and structure, right lower leg: Secondary | ICD-10-CM

## 2019-09-08 DIAGNOSIS — R030 Elevated blood-pressure reading, without diagnosis of hypertension: Secondary | ICD-10-CM | POA: Diagnosis not present

## 2019-09-08 DIAGNOSIS — E669 Obesity, unspecified: Secondary | ICD-10-CM

## 2019-09-08 LAB — LIPID PANEL
Cholesterol: 212 mg/dL — ABNORMAL HIGH (ref 0–200)
HDL: 48.3 mg/dL (ref >39.00)
LDL Cholesterol: 147 mg/dL — ABNORMAL HIGH (ref 0–99)
NonHDL: 164.18
Total CHOL/HDL Ratio: 4
Triglycerides: 88 mg/dL (ref 0.0–149.0)
VLDL: 17.6 mg/dL (ref 0.0–40.0)

## 2019-09-08 LAB — CBC WITH DIFFERENTIAL/PLATELET
Basophils Absolute: 0 10*3/uL (ref 0.0–0.1)
Basophils Relative: 0.3 % (ref 0.0–3.0)
Eosinophils Absolute: 0.3 10*3/uL (ref 0.0–0.7)
Eosinophils Relative: 3.6 % (ref 0.0–5.0)
HCT: 42.4 % (ref 36.0–46.0)
Hemoglobin: 14.1 g/dL (ref 12.0–15.0)
Lymphocytes Relative: 30 % (ref 12.0–46.0)
Lymphs Abs: 2.4 10*3/uL (ref 0.7–4.0)
MCHC: 33.3 g/dL (ref 30.0–36.0)
MCV: 91 fl (ref 78.0–100.0)
Monocytes Absolute: 0.6 10*3/uL (ref 0.1–1.0)
Monocytes Relative: 7.8 % (ref 3.0–12.0)
Neutro Abs: 4.6 10*3/uL (ref 1.4–7.7)
Neutrophils Relative %: 58.3 % (ref 43.0–77.0)
Platelets: 280 10*3/uL (ref 150.0–400.0)
RBC: 4.66 Mil/uL (ref 3.87–5.11)
RDW: 12.7 % (ref 11.5–15.5)
WBC: 7.9 10*3/uL (ref 4.0–10.5)

## 2019-09-08 LAB — COMPREHENSIVE METABOLIC PANEL
ALT: 15 U/L (ref 0–35)
AST: 16 U/L (ref 0–37)
Albumin: 4.2 g/dL (ref 3.5–5.2)
Alkaline Phosphatase: 60 U/L (ref 39–117)
BUN: 14 mg/dL (ref 6–23)
CO2: 27 mEq/L (ref 19–32)
Calcium: 9.7 mg/dL (ref 8.4–10.5)
Chloride: 104 mEq/L (ref 96–112)
Creatinine, Ser: 0.87 mg/dL (ref 0.40–1.20)
GFR: 79.04 mL/min (ref >60.00)
Glucose, Bld: 97 mg/dL (ref 70–99)
Potassium: 4 mEq/L (ref 3.5–5.1)
Sodium: 139 mEq/L (ref 135–145)
Total Bilirubin: 0.7 mg/dL (ref 0.2–1.2)
Total Protein: 7.5 g/dL (ref 6.0–8.3)

## 2019-09-08 LAB — HEMOGLOBIN A1C: Hgb A1c MFr Bld: 6 % (ref 4.6–6.5)

## 2019-09-08 LAB — TSH: TSH: 1.11 u[IU]/mL (ref 0.35–4.50)

## 2019-09-08 NOTE — Assessment & Plan Note (Addendum)
DEXA up-to-date She will restart regular walking She is taking calcium and vitamin D daily CBC, CMP, TSH

## 2019-09-08 NOTE — Assessment & Plan Note (Signed)
Not currently exercising and has gained weight since last year Encouraged weight loss and she does want to lose weight Will start regular walking Healthy diet, decrease portions Follow-up in 1 year, sooner if needed

## 2019-09-08 NOTE — Assessment & Plan Note (Addendum)
BP has been consistently high here, but typically is better at home She has not checked it recently at home, but will start checking it regularly Advised that if she is unsure if her cuff is accurate she should make a nurse appointment to have it calibrated Stressed good blood pressure controlled and discussed consequences of uncontrolled blood pressure-goal less than 140/90 consistently Start regular exercise, work on weight loss, low-sodium diet CMP, CBC, TSH

## 2019-09-08 NOTE — Assessment & Plan Note (Signed)
Check a1c Low sugar / carb diet Stressed regular exercise Advised weight loss  

## 2019-09-10 ENCOUNTER — Encounter: Payer: Self-pay | Admitting: Internal Medicine

## 2019-09-12 ENCOUNTER — Ambulatory Visit: Payer: 59 | Admitting: Sports Medicine

## 2020-08-01 DIAGNOSIS — Z90710 Acquired absence of both cervix and uterus: Secondary | ICD-10-CM | POA: Insufficient documentation

## 2020-09-07 ENCOUNTER — Ambulatory Visit: Payer: Self-pay | Attending: Internal Medicine

## 2020-09-07 DIAGNOSIS — Z23 Encounter for immunization: Secondary | ICD-10-CM

## 2020-09-07 NOTE — Progress Notes (Signed)
   Covid-19 Vaccination [facility]  Name:  Sheila Solomon    MRN: [id number] DOB: [birth year]  09/07/2020  [name] was observed post Covid-19 immunization for 15 minutes without incident. She was provided with Vaccine Information Sheet and instruction to access the V-Safe system.   [name] was instructed to call 911 with any severe reactions post vaccine: Marland Kitchen Difficulty breathing  . Swelling of face and throat  . A fast heartbeat  . A bad rash all over body  . Dizziness and weakness

## 2020-09-12 ENCOUNTER — Encounter: Payer: Self-pay | Admitting: Internal Medicine

## 2020-09-12 NOTE — Progress Notes (Signed)
Subjective:    Patient ID: Sheila Solomon, female    DOB: [birth year], 64 y.o.   MRN: [id number]   This visit occurred during the SARS-CoV-2 public health emergency.  Safety protocols were in place, including screening questions prior to the visit, additional usage of staff PPE, and extensive cleaning of exam room while observing appropriate contact time as indicated for disinfecting solutions.    HPI She is here for a physical exam.     Medications and allergies reviewed with patient and updated if appropriate.  Patient Active Problem List   Diagnosis Date Noted  . Elevated blood pressure reading 09/23/2018  . Obesity with body mass index 30 or greater 12/31/2017  . Prediabetes 12/31/2017  . Vitamin D deficiency 08/14/2010  . Osteopenia 11/12/2008  . HYPERLIPIDEMIA 11/14/2007    Current Outpatient Medications on File Prior to Visit  Medication Sig Dispense Refill  . calcium carbonate (OS-CAL) 600 MG TABS tablet Take 600 mg by mouth 2 (two) times daily with a meal.    . Cholecalciferol (VITAMIN D3) 2000 UNITS TABS Take 1 tablet by mouth daily.    Marland Kitchen MAGNESIUM PO Take by mouth.    . Multiple Vitamin (MULTIVITAMIN) tablet Take 1 tablet by mouth daily.    Marland Kitchen POTASSIUM PO Take by mouth.    . vitamin C (ASCORBIC ACID) 500 MG tablet Take 500 mg by mouth as needed.     No current facility-administered medications on file prior to visit.    Past Medical History:  Diagnosis Date  . Cataract   . Hyperlipidemia   . Osteopenia    [facility]  . Vitamin D deficiency     Past Surgical History:  Procedure Laterality Date  . CATARACT EXTRACTION     15 years ago  . COLONOSCOPY  2012    [facility];negative; due 2022  . G 1 P 1     C section  . TOTAL ABDOMINAL HYSTERECTOMY     fibroids & dysfunctional menses    Social History   Socioeconomic History  . Marital status: Divorced    Spouse name: Not on file  . Number of children: Not on file  . Years of education: Not  on file  . Highest education level: Not on file  Occupational History  . Not on file  Tobacco Use  . Smoking status: Never Smoker  . Smokeless tobacco: Never Used  Substance and Sexual Activity  . Alcohol use: No  . Drug use: No  . Sexual activity: Not on file  Other Topics Concern  . Not on file  Social History Narrative   Exercise: 1 day a week   Social Determinants of Health   Financial Resource Strain:   . Difficulty of Paying Living Expenses: Not on file  Food Insecurity:   . Worried About Charity fundraiser in the Last Year: Not on file  . Ran Out of Food in the Last Year: Not on file  Transportation Needs:   . Lack of Transportation (Medical): Not on file  . Lack of Transportation (Non-Medical): Not on file  Physical Activity:   . Days of Exercise per Week: Not on file  . Minutes of Exercise per Session: Not on file  Stress:   . Feeling of Stress : Not on file  Social Connections:   . Frequency of Communication with Friends and Family: Not on file  . Frequency of Social Gatherings with Friends and Family: Not on file  .  Attends Religious Services: Not on file  . Active Member of Clubs or Organizations: Not on file  . Attends Archivist Meetings: Not on file  . Marital Status: Not on file    Family History  Problem Relation Age of Onset  . Hypertension Mother   . Coronary artery disease Mother        4 v CBAG in 49s  . Hypertension Father   . Diabetes Father   . Other Father        elevated PSA  . Dementia Father   . Hypertension Sister   . Hypertension Brother   . Stroke Paternal Grandmother        in 39s  . Colon cancer Neg Hx   . Colon polyps Neg Hx   . Esophageal cancer Neg Hx   . Stomach cancer Neg Hx   . Rectal cancer Neg Hx     Review of Systems  Constitutional: Negative for chills and fever.  Eyes: Negative for visual disturbance.  Respiratory: Negative for cough, shortness of breath and wheezing.   Cardiovascular: Negative for  chest pain, palpitations and leg swelling.  Gastrointestinal: Negative for abdominal pain, blood in stool, constipation, diarrhea and nausea.       No gerd  Genitourinary: Negative for dysuria and hematuria.  Musculoskeletal: Negative for arthralgias and back pain.  Skin: Negative for color change and rash.  Neurological: Negative for dizziness, light-headedness and headaches.  Psychiatric/Behavioral: Negative for dysphoric mood. The patient is not nervous/anxious.        Objective:   Vitals:   09/13/20 0919  BP: 138/74  Pulse: 85  Temp: 98 F (36.7 C)  SpO2: 97%   Filed Weights   09/13/20 0919  Weight: 198 lb (89.8 kg)   Body mass index is 31.96 kg/m.  BP Readings from Last 3 Encounters:  09/13/20 138/74  09/08/19 (!) 154/102  09/23/18 (!) 144/96    Wt Readings from Last 3 Encounters:  09/13/20 198 lb (89.8 kg)  09/08/19 203 lb (92.1 kg)  09/23/18 193 lb 12.8 oz (87.9 kg)     Physical Exam Constitutional: She appears well-developed and well-nourished. No distress.  HENT:  Head: Normocephalic and atraumatic.  Right Ear: External ear normal. Normal ear canal and TM Left Ear: External ear normal.  Normal ear canal and TM Mouth/Throat: Oropharynx is clear and moist.  Eyes: Conjunctivae and EOM are normal.  Neck: Neck supple. No tracheal deviation present. No thyromegaly present.  No carotid bruit  Cardiovascular: Normal rate, regular rhythm and normal heart sounds.   No murmur heard.  No edema. Pulmonary/Chest: Effort normal and breath sounds normal. No respiratory distress. She has no wheezes. She has no rales.  Breast: deferred   Abdominal: Soft. She exhibits no distension. There is no tenderness.  Lymphadenopathy: She has no cervical adenopathy.  Skin: Skin is warm and dry. She is not diaphoretic.  Psychiatric: She has a normal mood and affect. Her behavior is normal.        Assessment & Plan:   Physical exam: Screening blood work     ordered Immunizations  Flu deferred, prevnar deferred, tdap up to date, discussed shingrix Colonoscopy  Up to date  Mammogram  Up to date  Gyn  Up to date - [facility] Dexa  Due - will order Eye exams  Up to date  Exercise  Water aerobics Weight  Advised weight loss Substance abuse   none      See Problem List  for Assessment and Plan of chronic medical problems.

## 2020-09-12 NOTE — Patient Instructions (Addendum)
Blood work was ordered.    All other Health Maintenance issues reviewed.   All recommended immunizations and age-appropriate screenings are up-to-date or discussed.  No immunization administered today.   Medications reviewed and updated.  Changes include :   none     Please followup in 1 year    Health Maintenance, Female Adopting a healthy lifestyle and getting preventive care are important in promoting health and wellness. Ask your health care provider about:  The right schedule for you to have regular tests and exams.  Things you can do on your own to prevent diseases and keep yourself healthy. What should I know about diet, weight, and exercise? Eat a healthy diet   Eat a diet that includes plenty of vegetables, fruits, low-fat dairy products, and lean protein.  Do not eat a lot of foods that are high in solid fats, added sugars, or sodium. Maintain a healthy weight Body mass index (BMI) is used to identify weight problems. It estimates body fat based on height and weight. Your health care provider can help determine your BMI and help you achieve or maintain a healthy weight. Get regular exercise Get regular exercise. This is one of the most important things you can do for your health. Most adults should:  Exercise for at least 150 minutes each week. The exercise should increase your heart rate and make you sweat (moderate-intensity exercise).  Do strengthening exercises at least twice a week. This is in addition to the moderate-intensity exercise.  Spend less time sitting. Even light physical activity can be beneficial. Watch cholesterol and blood lipids Have your blood tested for lipids and cholesterol at 66 years of age, then have this test every 5 years. Have your cholesterol levels checked more often if:  Your lipid or cholesterol levels are high.  You are older than 66 years of age.  You are at high risk for heart disease. What should I know about cancer  screening? Depending on your health history and family history, you may need to have cancer screening at various ages. This may include screening for:  Breast cancer.  Cervical cancer.  Colorectal cancer.  Skin cancer.  Lung cancer. What should I know about heart disease, diabetes, and high blood pressure? Blood pressure and heart disease  High blood pressure causes heart disease and increases the risk of stroke. This is more likely to develop in people who have high blood pressure readings, are of African descent, or are overweight.  Have your blood pressure checked: ? Every 3-5 years if you are 18-39 years of age. ? Every year if you are 40 years old or older. Diabetes Have regular diabetes screenings. This checks your fasting blood sugar level. Have the screening done:  Once every three years after age 40 if you are at a normal weight and have a low risk for diabetes.  More often and at a younger age if you are overweight or have a high risk for diabetes. What should I know about preventing infection? Hepatitis B If you have a higher risk for hepatitis B, you should be screened for this virus. Talk with your health care provider to find out if you are at risk for hepatitis B infection. Hepatitis C Testing is recommended for:  Everyone born from 1945 through 1965.  Anyone with known risk factors for hepatitis C. Sexually transmitted infections (STIs)  Get screened for STIs, including gonorrhea and chlamydia, if: ? You are sexually active and are younger than 66 years   of age. ? You are older than 66 years of age and your health care provider tells you that you are at risk for this type of infection. ? Your sexual activity has changed since you were last screened, and you are at increased risk for chlamydia or gonorrhea. Ask your health care provider if you are at risk.  Ask your health care provider about whether you are at high risk for HIV. Your health care provider may  recommend a prescription medicine to help prevent HIV infection. If you choose to take medicine to prevent HIV, you should first get tested for HIV. You should then be tested every 3 months for as long as you are taking the medicine. Pregnancy  If you are about to stop having your period (premenopausal) and you may become pregnant, seek counseling before you get pregnant.  Take 400 to 800 micrograms (mcg) of folic acid every day if you become pregnant.  Ask for birth control (contraception) if you want to prevent pregnancy. Osteoporosis and menopause Osteoporosis is a disease in which the bones lose minerals and strength with aging. This can result in bone fractures. If you are 65 years old or older, or if you are at risk for osteoporosis and fractures, ask your health care provider if you should:  Be screened for bone loss.  Take a calcium or vitamin D supplement to lower your risk of fractures.  Be given hormone replacement therapy (HRT) to treat symptoms of menopause. Follow these instructions at home: Lifestyle  Do not use any products that contain nicotine or tobacco, such as cigarettes, e-cigarettes, and chewing tobacco. If you need help quitting, ask your health care provider.  Do not use street drugs.  Do not share needles.  Ask your health care provider for help if you need support or information about quitting drugs. Alcohol use  Do not drink alcohol if: ? Your health care provider tells you not to drink. ? You are pregnant, may be pregnant, or are planning to become pregnant.  If you drink alcohol: ? Limit how much you use to 0-1 drink a day. ? Limit intake if you are breastfeeding.  Be aware of how much alcohol is in your drink. In the [place] one drink equals one 12 oz bottle of beer (355 mL), one 5 oz glass of wine (148 mL), or one 1 oz glass of hard liquor (44 mL). General instructions  Schedule regular health, dental, and eye exams.  Stay current with your  vaccines.  Tell your health care provider if: ? You often feel depressed. ? You have ever been abused or do not feel safe at home. Summary  Adopting a healthy lifestyle and getting preventive care are important in promoting health and wellness.  Follow your health care provider's instructions about healthy diet, exercising, and getting tested or screened for diseases.  Follow your health care provider's instructions on monitoring your cholesterol and blood pressure. This information is not intended to replace advice given to you by your health care provider. Make sure you discuss any questions you have with your health care provider. Document Revised: 11/02/2018 Document Reviewed: 11/02/2018 Elsevier Patient Education  2020 Elsevier Inc.  

## 2020-09-13 ENCOUNTER — Ambulatory Visit (INDEPENDENT_AMBULATORY_CARE_PROVIDER_SITE_OTHER): Payer: BC Managed Care – PPO | Admitting: Internal Medicine

## 2020-09-13 ENCOUNTER — Other Ambulatory Visit: Payer: Self-pay

## 2020-09-13 ENCOUNTER — Encounter: Payer: Self-pay | Admitting: Internal Medicine

## 2020-09-13 VITALS — BP 138/74 | HR 85 | Temp 98.0°F | Ht 66.0 in | Wt 198.0 lb

## 2020-09-13 DIAGNOSIS — M85861 Other specified disorders of bone density and structure, right lower leg: Secondary | ICD-10-CM | POA: Diagnosis not present

## 2020-09-13 DIAGNOSIS — E782 Mixed hyperlipidemia: Secondary | ICD-10-CM | POA: Diagnosis not present

## 2020-09-13 DIAGNOSIS — Z0000 Encounter for general adult medical examination without abnormal findings: Secondary | ICD-10-CM

## 2020-09-13 DIAGNOSIS — E669 Obesity, unspecified: Secondary | ICD-10-CM

## 2020-09-13 DIAGNOSIS — R7303 Prediabetes: Secondary | ICD-10-CM

## 2020-09-13 DIAGNOSIS — E559 Vitamin D deficiency, unspecified: Secondary | ICD-10-CM

## 2020-09-13 LAB — COMPREHENSIVE METABOLIC PANEL
ALT: 13 U/L (ref 0–35)
AST: 18 U/L (ref 0–37)
Albumin: 4.1 g/dL (ref 3.5–5.2)
Alkaline Phosphatase: 52 U/L (ref 39–117)
BUN: 13 mg/dL (ref 6–23)
CO2: 29 mEq/L (ref 19–32)
Calcium: 9.6 mg/dL (ref 8.4–10.5)
Chloride: 103 mEq/L (ref 96–112)
Creatinine, Ser: 0.86 mg/dL (ref 0.40–1.20)
GFR: 70.54 mL/min (ref >60.00)
Glucose, Bld: 93 mg/dL (ref 70–99)
Potassium: 3.8 mEq/L (ref 3.5–5.1)
Sodium: 138 mEq/L (ref 135–145)
Total Bilirubin: 0.7 mg/dL (ref 0.2–1.2)
Total Protein: 7.4 g/dL (ref 6.0–8.3)

## 2020-09-13 LAB — HEMOGLOBIN A1C: Hgb A1c MFr Bld: 6 % (ref 4.6–6.5)

## 2020-09-13 LAB — CBC WITH DIFFERENTIAL/PLATELET
Basophils Absolute: 0.1 10*3/uL (ref 0.0–0.1)
Basophils Relative: 0.8 % (ref 0.0–3.0)
Eosinophils Absolute: 0.3 10*3/uL (ref 0.0–0.7)
Eosinophils Relative: 3 % (ref 0.0–5.0)
HCT: 41.6 % (ref 36.0–46.0)
Hemoglobin: 14 g/dL (ref 12.0–15.0)
Lymphocytes Relative: 28.3 % (ref 12.0–46.0)
Lymphs Abs: 2.4 10*3/uL (ref 0.7–4.0)
MCHC: 33.6 g/dL (ref 30.0–36.0)
MCV: 90.5 fl (ref 78.0–100.0)
Monocytes Absolute: 0.7 10*3/uL (ref 0.1–1.0)
Monocytes Relative: 8.4 % (ref 3.0–12.0)
Neutro Abs: 5 10*3/uL (ref 1.4–7.7)
Neutrophils Relative %: 59.5 % (ref 43.0–77.0)
Platelets: 290 10*3/uL (ref 150.0–400.0)
RBC: 4.6 Mil/uL (ref 3.87–5.11)
RDW: 12.6 % (ref 11.5–15.5)
WBC: 8.4 10*3/uL (ref 4.0–10.5)

## 2020-09-13 LAB — LIPID PANEL
Cholesterol: 210 mg/dL — ABNORMAL HIGH (ref 0–200)
HDL: 48.1 mg/dL (ref >39.00)
LDL Cholesterol: 140 mg/dL — ABNORMAL HIGH (ref 0–99)
NonHDL: 161.53
Total CHOL/HDL Ratio: 4
Triglycerides: 109 mg/dL (ref 0.0–149.0)
VLDL: 21.8 mg/dL (ref 0.0–40.0)

## 2020-09-13 LAB — TSH: TSH: 1.31 u[IU]/mL (ref 0.35–4.50)

## 2020-09-13 LAB — VITAMIN D 25 HYDROXY (VIT D DEFICIENCY, FRACTURES): VITD: 25.39 ng/mL — ABNORMAL LOW (ref 30.00–100.00)

## 2020-09-13 NOTE — Assessment & Plan Note (Signed)
Discussed importance of weight loss ?Stressed regular exercise - goal 30 minutes 5 days a week ?Discussed decreasing portions, decreasing sugars/carbs ?Increase veges, lean protin ? ? ?

## 2020-09-13 NOTE — Assessment & Plan Note (Signed)
Chronic dexa due - ordered Exercising Taking calcium and vitamin d

## 2020-09-13 NOTE — Addendum Note (Signed)
Addended by: Boris Lown B on: 09/13/2020 09:53 AM   Modules accepted: Orders

## 2020-09-13 NOTE — Assessment & Plan Note (Signed)
Chronic Check lipid panel  Lifestyle controlled Regular exercise and healthy diet encouraged  

## 2020-09-13 NOTE — Assessment & Plan Note (Signed)
Chronic Check a1c Low sugar / carb diet Stressed regular exercise  

## 2020-09-13 NOTE — Assessment & Plan Note (Signed)
Chronic Taking vitamin d daily Check vitamin d level  

## 2020-12-23 ENCOUNTER — Ambulatory Visit (INDEPENDENT_AMBULATORY_CARE_PROVIDER_SITE_OTHER)
Admission: RE | Admit: 2020-12-23 | Discharge: 2020-12-23 | Disposition: A | Discharge status: Home / Self Care | Payer: BC Managed Care – PPO | Source: Ambulatory Visit | Attending: Internal Medicine | Admitting: Internal Medicine

## 2020-12-23 ENCOUNTER — Other Ambulatory Visit: Payer: Self-pay

## 2020-12-23 DIAGNOSIS — M85861 Other specified disorders of bone density and structure, right lower leg: Secondary | ICD-10-CM | POA: Diagnosis not present

## 2020-12-26 ENCOUNTER — Encounter: Payer: Self-pay | Admitting: Internal Medicine

## 2021-06-26 ENCOUNTER — Ambulatory Visit: Payer: Self-pay | Admitting: Family Medicine

## 2021-06-26 VITALS — BP 142/90 | HR 68 | Ht 65.5 in | Wt 183.0 lb

## 2021-06-26 DIAGNOSIS — Z789 Other specified health status: Secondary | ICD-10-CM

## 2021-06-26 DIAGNOSIS — R030 Elevated blood-pressure reading, without diagnosis of hypertension: Secondary | ICD-10-CM

## 2021-06-26 NOTE — Progress Notes (Signed)
Subjective:     Patient ID: Sheila Solomon, female   DOB: December 17, 1955, 65 y.o.   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 She has a PCP she sees regularly, annual visit with lab work is due in November per pt. She reports mammogram was done last year, she gets them every 2 years. Pap smear done last year, no longer needed after age 75. Colonoscopy is UTD. She has received 1 covid-19 booster and is planning on getting a second booster. She is working on a Actuary, making significant changes focusing on healthy diet and exercise. She denies any current problems or concerns. She is feeling well.   Past Medical History:  Diagnosis Date   Cataract    Hyperlipidemia    Osteopenia    [place] [facility]   Vitamin D deficiency    Allergies  Allergen Reactions   Prednisone     REACTION: rash & facial swelling. The prednisone was prescribed for rash related to seafood ingestion; but she developed facial swelling and hives after taking the prednisone. This was not facial edema related to long-term prednisone or an acneiform rash with prednisone.    Current Outpatient Medications:    calcium carbonate (OS-CAL) 600 MG TABS tablet, Take 600 mg by mouth 2 (two) times daily with a meal., Disp: , Rfl:    Cholecalciferol (VITAMIN D3) 2000 UNITS TABS, Take 1 tablet by mouth daily., Disp: , Rfl:    MAGNESIUM PO, Take by mouth., Disp: , Rfl:    Multiple Vitamin (MULTIVITAMIN) tablet, Take 1 tablet by mouth daily., Disp: , Rfl:    POTASSIUM PO, Take by mouth., Disp: , Rfl:    vitamin C (ASCORBIC ACID) 500 MG tablet, Take 500 mg by mouth as needed., Disp: , Rfl:   Review of Systems  Constitutional:  Negative for chills, fatigue, fever and unexpected weight change.  HENT:  Negative for congestion, ear pain, sinus pressure, sinus pain and sore throat.   Eyes:  Negative for discharge and visual  disturbance.  Respiratory:  Negative for cough, shortness of breath and wheezing.   Cardiovascular:  Negative for chest pain and leg swelling.  Gastrointestinal:  Negative for abdominal pain, blood in stool, constipation, diarrhea, nausea and vomiting.  Genitourinary:  Negative for difficulty urinating and hematuria.  Skin:  Negative for color change.  Neurological:  Negative for dizziness, weakness, light-headedness and headaches.  Hematological:  Negative for adenopathy.  All other systems reviewed and are negative.     Objective:   Physical Exam Vitals reviewed.  Constitutional:      General: She is not in acute distress.    Appearance: Normal appearance. She is well-developed.  HENT:     Head: Normocephalic and atraumatic.  Eyes:     General:        Right eye: No discharge.        Left eye: No discharge.  Cardiovascular:     Rate and Rhythm: Normal rate and regular rhythm.     Heart sounds: Normal heart sounds.  Pulmonary:     Effort: Pulmonary effort is normal. No respiratory distress.     Breath sounds: Normal breath sounds.  Musculoskeletal:     Cervical back: Neck supple.  Skin:    General: Skin is warm and dry.  Neurological:     Mental Status: She is alert and oriented to person, place, and time.  Psychiatric:        Mood and Affect: Mood normal.        Behavior: Behavior normal.  Today's Vitals   06/26/21 1000  BP: (!) 142/90  Pulse: 68  SpO2: 99%  Weight: 183 lb (83 kg)  Height: 5' 5.5" (1.664 m)   Body mass index is 29.99 kg/m.      Assessment:     Participant in health and wellness plan  Elevated blood pressure reading     Plan:     Keep all regular appts with PCP. Continue efforts at healthy lifestyle changes.  BP elevated today. She will keep a check on it here at work. If remains 140/90 or higher recommend f/u with PCP for management options. DASH diet and exercise encouraged. F/u here prn.

## 2022-06-26 ENCOUNTER — Telehealth: Payer: Self-pay

## 2022-06-26 DIAGNOSIS — R21 Rash and other nonspecific skin eruption: Secondary | ICD-10-CM

## 2022-06-26 NOTE — Telephone Encounter (Signed)
Pt is requesting a referral to see Dermatology to see a itchy female rash. Pt states she started swimming about 2 months ago and has had the rash for about 4-6 weeks.   Please advise

## 2022-06-26 NOTE — Telephone Encounter (Signed)
I ordered a referral  but it may take months to get in with a dermatologist.  I would recommend scheduling an appointment with me or someone at our office  - we should be able to treat this

## 2022-06-26 NOTE — Telephone Encounter (Signed)
Called pt Sheila Solomon states the rash is not bad., but it keep appearing. Sheila Solomon would like to see Dr. Otila Back if possible. Would hold off on making appt with Dr. Quay Burow. Referral has been place long with Dr. Otila Back name.Marland KitchenJohny Chess

## 2022-06-30 ENCOUNTER — Ambulatory Visit: Payer: Self-pay | Admitting: Nurse Practitioner

## 2022-06-30 ENCOUNTER — Other Ambulatory Visit: Payer: Self-pay | Admitting: Internal Medicine

## 2022-06-30 ENCOUNTER — Telehealth: Payer: Self-pay

## 2022-06-30 ENCOUNTER — Encounter: Payer: Self-pay | Admitting: Nurse Practitioner

## 2022-06-30 VITALS — BP 160/92 | HR 89

## 2022-06-30 DIAGNOSIS — L853 Xerosis cutis: Secondary | ICD-10-CM

## 2022-06-30 DIAGNOSIS — Z1231 Encounter for screening mammogram for malignant neoplasm of breast: Secondary | ICD-10-CM

## 2022-06-30 DIAGNOSIS — Z789 Other specified health status: Secondary | ICD-10-CM

## 2022-06-30 DIAGNOSIS — R030 Elevated blood-pressure reading, without diagnosis of hypertension: Secondary | ICD-10-CM

## 2022-06-30 NOTE — Telephone Encounter (Signed)
-----   Message from Allegra Lai sent at 06/26/2022  1:35 PM EDT ----- Regarding: Dermatology Referral  Patient need OV for referral  ,last OV with Dr Quay Burow  October 2021 ,  please advise .

## 2022-06-30 NOTE — Telephone Encounter (Signed)
Reached out to inform patient she would need return OV for referral and patient has declined visit.  She states she is to busy to come in and will schedule her annual visit in November.

## 2022-06-30 NOTE — Progress Notes (Signed)
Office Visit  Subjective:  Patient ID: Sheila Solomon, female    DOB: 01-27-1956  Age: 66 y.o. MRN: [id number]  CC: Wellness exam  HPI Sheila Solomon presents for wellness exam visit for insurance benefit.  Patient has a PCP: Dr. Billey [facility] at [facility]  PMH significant for: vit D def- on vitamin supplements. Last labs per PCP were completed: 10/21.  Health Maintenance:  Colonoscopy: 2019, repeat in 5 years . Mammogram: 2019- due for this. DEXA: 11/2020- showed osteopenia    Smoker: never   Immunizations:  Shingrix- never, declines Pneumovax - declines COVID- x 3  Lifestyle: Diet- has cut down on high carb and fried food  Exercise-swimming three times a week for 1 hour.  Plays pickleball as well.  Dry skin: reports over the past 1-2 weeks she has noticed 2 small areas of dry itchy skin. She thinks it may be related to a new body wash she is using. Recently tried Aquaphor which she says seems to help.     Past Medical History:  Diagnosis Date   Cataract    Hyperlipidemia    Osteopenia    [place] [facility]   Vitamin D deficiency     Past Surgical History:  Procedure Laterality Date   CATARACT EXTRACTION     15 years ago   COLONOSCOPY  2012    [facility];negative; due 2022   G 1 P 1     C section   TOTAL ABDOMINAL HYSTERECTOMY     fibroids & dysfunctional menses    Outpatient Medications Prior to Visit  Medication Sig Dispense Refill   calcium carbonate (OS-CAL) 600 MG TABS tablet Take 600 mg by mouth 2 (two) times daily with a meal.     Cholecalciferol (VITAMIN D3) 2000 UNITS TABS Take 1 tablet by mouth daily.     MAGNESIUM PO Take by mouth.     Multiple Vitamin (MULTIVITAMIN) tablet Take 1 tablet by mouth daily.     POTASSIUM PO Take by mouth.     vitamin C (ASCORBIC ACID) 500 MG tablet Take 500 mg by mouth as needed.     No facility-administered medications prior to visit.    ROS Review of Systems  Constitutional:  Negative for activity change and  fatigue.  Respiratory:  Negative for shortness of breath.   Cardiovascular:  Negative for chest pain.  Gastrointestinal:  Negative for constipation and diarrhea.  Skin:  Positive for rash.       Dry skin and itching  Neurological:  Negative for headaches.    Objective:  BP (!) 160/92   Pulse 89   SpO2 99%   Physical Exam Constitutional:      General: She is not in acute distress. HENT:     Head: Normocephalic.  Cardiovascular:     Rate and Rhythm: Normal rate and regular rhythm.  Pulmonary:     Effort: Pulmonary effort is normal.     Breath sounds: Normal breath sounds.  Chest:  Breasts:    Right: No mass, nipple discharge or tenderness.       Comments: 2 small patches of hyperpigmented dry skin noted on right breast. No erythema.  Musculoskeletal:        General: Normal range of motion.  Skin:    General: Skin is warm.  Neurological:     General: No focal deficit present.     Mental Status: She is alert and oriented to person, place, and time.  Psychiatric:  Mood and Affect: Mood normal.        Behavior: Behavior normal.      Assessment & Plan:    Sheila Solomon was seen today for wellness exam.  Diagnoses and all orders for this visit:  Participant in health and wellness plan Adult wellness physical was conducted today. Importance of diet and exercise were discussed in detail.  We reviewed immunizations and gave recommendations regarding current immunization needed for age.  Preventative health exams needed: discussed obtaining mammogram which she is overdue for. Labs per PCP.   Patient was advised yearly wellness exam  Dry skin Patient will continue using Aquaphor which seems to help. Can also consider trial of otc hydrocortisone. Follow-up if no improvement.   Elevated blood pressure reading BP elevated in office today. Patient will return later today for BP re-check. Discussed monitoring BP at home as well.    No orders of the defined types were  placed in this encounter.   No orders of the defined types were placed in this encounter.   Follow-up: Return if symptoms worsen or fail to improve. Return later today for BP check.

## 2022-07-02 ENCOUNTER — Ambulatory Visit
Admission: RE | Admit: 2022-07-02 | Discharge: 2022-07-02 | Disposition: A | Discharge status: Home / Self Care | Payer: PRIVATE HEALTH INSURANCE | Source: Ambulatory Visit | Attending: Internal Medicine | Admitting: Internal Medicine

## 2022-07-02 DIAGNOSIS — Z1231 Encounter for screening mammogram for malignant neoplasm of breast: Secondary | ICD-10-CM

## 2022-09-28 NOTE — Patient Instructions (Addendum)
Blood work was ordered.   The lab is on the first floor.    Medications changes include :       A referral was ordered for XXX.     Someone will call you to schedule an appointment.    Return in about 1 year (around 10/01/2023) for Physical Exam.    Health Maintenance, Female Adopting a healthy lifestyle and getting preventive care are important in promoting health and wellness. Ask your health care provider about: The right schedule for you to have regular tests and exams. Things you can do on your own to prevent diseases and keep yourself healthy. What should I know about diet, weight, and exercise? Eat a healthy diet  Eat a diet that includes plenty of vegetables, fruits, low-fat dairy products, and lean protein. Do not eat a lot of foods that are high in solid fats, added sugars, or sodium. Maintain a healthy weight Body mass index (BMI) is used to identify weight problems. It estimates body fat based on height and weight. Your health care provider can help determine your BMI and help you achieve or maintain a healthy weight. Get regular exercise Get regular exercise. This is one of the most important things you can do for your health. Most adults should: Exercise for at least 150 minutes each week. The exercise should increase your heart rate and make you sweat (moderate-intensity exercise). Do strengthening exercises at least twice a week. This is in addition to the moderate-intensity exercise. Spend less time sitting. Even light physical activity can be beneficial. Watch cholesterol and blood lipids Have your blood tested for lipids and cholesterol at 68 years of age, then have this test every 5 years. Have your cholesterol levels checked more often if: Your lipid or cholesterol levels are high. You are older than 68 years of age. You are at high risk for heart disease. What should I know about cancer screening? Depending on your health history and family  history, you may need to have cancer screening at various ages. This may include screening for: Breast cancer. Cervical cancer. Colorectal cancer. Skin cancer. Lung cancer. What should I know about heart disease, diabetes, and high blood pressure? Blood pressure and heart disease High blood pressure causes heart disease and increases the risk of stroke. This is more likely to develop in people who have high blood pressure readings or are overweight. Have your blood pressure checked: Every 3-5 years if you are 72-30 years of age. Every year if you are 19 years old or older. Diabetes Have regular diabetes screenings. This checks your fasting blood sugar level. Have the screening done: Once every three years after age 20 if you are at a normal weight and have a low risk for diabetes. More often and at a younger age if you are overweight or have a high risk for diabetes. What should I know about preventing infection? Hepatitis B If you have a higher risk for hepatitis B, you should be screened for this virus. Talk with your health care provider to find out if you are at risk for hepatitis B infection. Hepatitis C Testing is recommended for: Everyone born from [birth year] through 1965. Anyone with known risk factors for hepatitis C. Sexually transmitted infections (STIs) Get screened for STIs, including gonorrhea and chlamydia, if: You are sexually active and are younger than 68 years of age. You are older than 68 years of age and your health care provider tells you that you are  at risk for this type of infection. Your sexual activity has changed since you were last screened, and you are at increased risk for chlamydia or gonorrhea. Ask your health care provider if you are at risk. Ask your health care provider about whether you are at high risk for HIV. Your health care provider may recommend a prescription medicine to help prevent HIV infection. If you choose to take medicine to prevent HIV, you  should first get tested for HIV. You should then be tested every 3 months for as long as you are taking the medicine. Pregnancy If you are about to stop having your period (premenopausal) and you may become pregnant, seek counseling before you get pregnant. Take 400 to 800 micrograms (mcg) of folic acid every day if you become pregnant. Ask for birth control (contraception) if you want to prevent pregnancy. Osteoporosis and menopause Osteoporosis is a disease in which the bones lose minerals and strength with aging. This can result in bone fractures. If you are 16 years old or older, or if you are at risk for osteoporosis and fractures, ask your health care provider if you should: Be screened for bone loss. Take a calcium or vitamin D supplement to lower your risk of fractures. Be given hormone replacement therapy (HRT) to treat symptoms of menopause. Follow these instructions at home: Alcohol use Do not drink alcohol if: Your health care provider tells you not to drink. You are pregnant, may be pregnant, or are planning to become pregnant. If you drink alcohol: Limit how much you have to: 0-1 drink a day. Know how much alcohol is in your drink. In the [place] one drink equals one 12 oz bottle of beer (355 mL), one 5 oz glass of wine (148 mL), or one 1 oz glass of hard liquor (44 mL). Lifestyle Do not use any products that contain nicotine or tobacco. These products include cigarettes, chewing tobacco, and vaping devices, such as e-cigarettes. If you need help quitting, ask your health care provider. Do not use street drugs. Do not share needles. Ask your health care provider for help if you need support or information about quitting drugs. General instructions Schedule regular health, dental, and eye exams. Stay current with your vaccines. Tell your health care provider if: You often feel depressed. You have ever been abused or do not feel safe at home. Summary Adopting a healthy  lifestyle and getting preventive care are important in promoting health and wellness. Follow your health care provider's instructions about healthy diet, exercising, and getting tested or screened for diseases. Follow your health care provider's instructions on monitoring your cholesterol and blood pressure. This information is not intended to replace advice given to you by your health care provider. Make sure you discuss any questions you have with your health care provider. Document Revised: 03/31/2021 Document Reviewed: 03/31/2021 Elsevier Patient Education  [place].

## 2022-09-28 NOTE — Progress Notes (Signed)
Subjective:    Patient ID: Sheila Solomon, female    DOB: 1956-04-20, 66 y.o.   MRN: [id number]      HPI Sheila Solomon is here for a Physical exam.      Medications and allergies reviewed with patient and updated if appropriate.  Current Outpatient Medications on File Prior to Visit  Medication Sig Dispense Refill  . calcium carbonate (OS-CAL) 600 MG TABS tablet Take 600 mg by mouth 2 (two) times daily with a meal.    . Cholecalciferol (VITAMIN D3) 2000 UNITS TABS Take 1 tablet by mouth daily.    Marland Kitchen MAGNESIUM PO Take by mouth.    . Multiple Vitamin (MULTIVITAMIN) tablet Take 1 tablet by mouth daily.    Marland Kitchen POTASSIUM PO Take by mouth.    . vitamin C (ASCORBIC ACID) 500 MG tablet Take 500 mg by mouth as needed.     No current facility-administered medications on file prior to visit.    Review of Systems     Objective:  There were no vitals filed for this visit. There were no vitals filed for this visit. There is no height or weight on file to calculate BMI.  BP Readings from Last 3 Encounters:  10/05/22 (!) 150/90  06/30/22 (!) 160/92  06/26/21 (!) 142/90    Wt Readings from Last 3 Encounters:  10/05/22 186 lb (84.4 kg)  06/26/21 183 lb (83 kg)  09/13/20 198 lb (89.8 kg)       Physical Exam Constitutional: She appears well-developed and well-nourished. No distress.  HENT:  Head: Normocephalic and atraumatic.  Right Ear: External ear normal. Normal ear canal and TM Left Ear: External ear normal.  Normal ear canal and TM Mouth/Throat: Oropharynx is clear and moist.  Eyes: Conjunctivae normal.  Neck: Neck supple. No tracheal deviation present. No thyromegaly present.  No carotid bruit  Cardiovascular: Normal rate, regular rhythm and normal heart sounds.   No murmur heard.  No edema. Pulmonary/Chest: Effort normal and breath sounds normal. No respiratory distress. She has no wheezes. She has no rales.  Breast: deferred   Abdominal: Soft. She exhibits no  distension. There is no tenderness.  Lymphadenopathy: She has no cervical adenopathy.  Skin: Skin is warm and dry. She is not diaphoretic.  Psychiatric: She has a normal mood and affect. Her behavior is normal.     Lab Results  Component Value Date   WBC 8.4 09/13/2020   HGB 14.0 09/13/2020   HCT 41.6 09/13/2020   PLT 290.0 09/13/2020   GLUCOSE 93 09/13/2020   CHOL 210 (H) 09/13/2020   TRIG 109.0 09/13/2020   HDL 48.10 09/13/2020   LDLDIRECT 145.6 01/08/2012   LDLCALC 140 (H) 09/13/2020   ALT 13 09/13/2020   AST 18 09/13/2020   NA 138 09/13/2020   K 3.8 09/13/2020   CL 103 09/13/2020   CREATININE 0.86 09/13/2020   BUN 13 09/13/2020   CO2 29 09/13/2020   TSH 1.31 09/13/2020   HGBA1C 6.0 09/13/2020         Assessment & Plan:   Physical exam: Screening blood work  ordered Exercise   Weight   Substance abuse  none   Reviewed recommended immunizations.   Health Maintenance  Topic Date Due  . Zoster Vaccines- Shingrix (1 of 2) Never done  . TETANUS/TDAP  11/13/2017  . Pneumonia Vaccine 42+ Years old (1 - PCV) Never done  . COVID-19 Vaccine (2 - Pfizer series) 11/02/2020  . INFLUENZA VACCINE  06/23/2022  .  DEXA SCAN  12/24/2023  . MAMMOGRAM  07/02/2024  . COLONOSCOPY (Pts 45-26yr Insurance coverage will need to be confirmed)  02/19/2028  . Hepatitis C Screening  Completed  . HPV VACCINES  Aged Out          See Problem List for Assessment and Plan of chronic medical problems.     This encounter was created in error - please disregard.

## 2022-09-30 ENCOUNTER — Encounter: Payer: PRIVATE HEALTH INSURANCE | Admitting: Internal Medicine

## 2022-09-30 DIAGNOSIS — R7303 Prediabetes: Secondary | ICD-10-CM

## 2022-09-30 DIAGNOSIS — E559 Vitamin D deficiency, unspecified: Secondary | ICD-10-CM

## 2022-09-30 DIAGNOSIS — E669 Obesity, unspecified: Secondary | ICD-10-CM

## 2022-09-30 DIAGNOSIS — Z0000 Encounter for general adult medical examination without abnormal findings: Secondary | ICD-10-CM

## 2022-09-30 DIAGNOSIS — E782 Mixed hyperlipidemia: Secondary | ICD-10-CM

## 2022-09-30 DIAGNOSIS — M85861 Other specified disorders of bone density and structure, right lower leg: Secondary | ICD-10-CM

## 2022-10-04 ENCOUNTER — Encounter: Payer: Self-pay | Admitting: Internal Medicine

## 2022-10-04 NOTE — Patient Instructions (Addendum)
An EKG was done.   Blood work was ordered.   The lab is on the first floor.    Medications changes include :   triamcinolone cream for your back rash    A referral was ordered for dermatology.     Someone will call you to schedule an appointment.    Return in about 1 year (around 10/06/2023) for Physical Exam.    Health Maintenance, Female Adopting a healthy lifestyle and getting preventive care are important in promoting health and wellness. Ask your health care provider about: The right schedule for you to have regular tests and exams. Things you can do on your own to prevent diseases and keep yourself healthy. What should I know about diet, weight, and exercise? Eat a healthy diet  Eat a diet that includes plenty of vegetables, fruits, low-fat dairy products, and lean protein. Do not eat a lot of foods that are high in solid fats, added sugars, or sodium. Maintain a healthy weight Body mass index (BMI) is used to identify weight problems. It estimates body fat based on height and weight. Your health care provider can help determine your BMI and help you achieve or maintain a healthy weight. Get regular exercise Get regular exercise. This is one of the most important things you can do for your health. Most adults should: Exercise for at least 150 minutes each week. The exercise should increase your heart rate and make you sweat (moderate-intensity exercise). Do strengthening exercises at least twice a week. This is in addition to the moderate-intensity exercise. Spend less time sitting. Even light physical activity can be beneficial. Watch cholesterol and blood lipids Have your blood tested for lipids and cholesterol at 68 years of age, then have this test every 5 years. Have your cholesterol levels checked more often if: Your lipid or cholesterol levels are high. You are older than 68 years of age. You are at high risk for heart disease. What should I know about cancer  screening? Depending on your health history and family history, you may need to have cancer screening at various ages. This may include screening for: Breast cancer. Cervical cancer. Colorectal cancer. Skin cancer. Lung cancer. What should I know about heart disease, diabetes, and high blood pressure? Blood pressure and heart disease High blood pressure causes heart disease and increases the risk of stroke. This is more likely to develop in people who have high blood pressure readings or are overweight. Have your blood pressure checked: Every 3-5 years if you are 12-27 years of age. Every year if you are 66 years old or older. Diabetes Have regular diabetes screenings. This checks your fasting blood sugar level. Have the screening done: Once every three years after age 19 if you are at a normal weight and have a low risk for diabetes. More often and at a younger age if you are overweight or have a high risk for diabetes. What should I know about preventing infection? Hepatitis B If you have a higher risk for hepatitis B, you should be screened for this virus. Talk with your health care provider to find out if you are at risk for hepatitis B infection. Hepatitis C Testing is recommended for: Everyone born from [birth year] through 1965. Anyone with known risk factors for hepatitis C. Sexually transmitted infections (STIs) Get screened for STIs, including gonorrhea and chlamydia, if: You are sexually active and are younger than 68 years of age. You are older than 68 years of age and your  health care provider tells you that you are at risk for this type of infection. Your sexual activity has changed since you were last screened, and you are at increased risk for chlamydia or gonorrhea. Ask your health care provider if you are at risk. Ask your health care provider about whether you are at high risk for HIV. Your health care provider may recommend a prescription medicine to help prevent HIV  infection. If you choose to take medicine to prevent HIV, you should first get tested for HIV. You should then be tested every 3 months for as long as you are taking the medicine. Pregnancy If you are about to stop having your period (premenopausal) and you may become pregnant, seek counseling before you get pregnant. Take 400 to 800 micrograms (mcg) of folic acid every day if you become pregnant. Ask for birth control (contraception) if you want to prevent pregnancy. Osteoporosis and menopause Osteoporosis is a disease in which the bones lose minerals and strength with aging. This can result in bone fractures. If you are 68 years old or older, or if you are at risk for osteoporosis and fractures, ask your health care provider if you should: Be screened for bone loss. Take a calcium or vitamin D supplement to lower your risk of fractures. Be given hormone replacement therapy (HRT) to treat symptoms of menopause. Follow these instructions at home: Alcohol use Do not drink alcohol if: Your health care provider tells you not to drink. You are pregnant, may be pregnant, or are planning to become pregnant. If you drink alcohol: Limit how much you have to: 0-1 drink a day. Know how much alcohol is in your drink. In the [place] one drink equals one 12 oz bottle of beer (355 mL), one 5 oz glass of wine (148 mL), or one 1 oz glass of hard liquor (44 mL). Lifestyle Do not use any products that contain nicotine or tobacco. These products include cigarettes, chewing tobacco, and vaping devices, such as e-cigarettes. If you need help quitting, ask your health care provider. Do not use street drugs. Do not share needles. Ask your health care provider for help if you need support or information about quitting drugs. General instructions Schedule regular health, dental, and eye exams. Stay current with your vaccines. Tell your health care provider if: You often feel depressed. You have ever been abused  or do not feel safe at home. Summary Adopting a healthy lifestyle and getting preventive care are important in promoting health and wellness. Follow your health care provider's instructions about healthy diet, exercising, and getting tested or screened for diseases. Follow your health care provider's instructions on monitoring your cholesterol and blood pressure. This information is not intended to replace advice given to you by your health care provider. Make sure you discuss any questions you have with your health care provider. Document Revised: 03/31/2021 Document Reviewed: 03/31/2021 Elsevier Patient Education  [place].

## 2022-10-04 NOTE — Progress Notes (Unsigned)
Subjective:    Patient ID: Sheila Solomon, female    DOB: May 03, 1956, 66 y.o.   MRN: [id number]      HPI Sheila Solomon is here for a Physical exam.   Has rash in central lower back - has been applying aquaphor and it may be a little better.  Wants referral to derm.    Blood pressure has been elevated in the past, but has improved upon rechecking it.  She just bought a cough but has not been checking her blood pressure consistently.  Medications and allergies reviewed with patient and updated if appropriate.  Current Outpatient Medications on File Prior to Visit  Medication Sig Dispense Refill   calcium carbonate (OS-CAL) 600 MG TABS tablet Take 600 mg by mouth 2 (two) times daily with a meal.     Cholecalciferol (VITAMIN D3) 2000 UNITS TABS Take 1 tablet by mouth daily.     MAGNESIUM PO Take by mouth.     Multiple Vitamin (MULTIVITAMIN) tablet Take 1 tablet by mouth daily.     POTASSIUM PO Take by mouth.     vitamin C (ASCORBIC ACID) 500 MG tablet Take 500 mg by mouth as needed.     No current facility-administered medications on file prior to visit.    Review of Systems  Constitutional:  Negative for fever.  Eyes:  Negative for visual disturbance.  Respiratory:  Negative for cough, shortness of breath and wheezing.   Cardiovascular:  Negative for chest pain, palpitations and leg swelling.  Gastrointestinal:  Negative for abdominal pain, blood in stool, constipation, diarrhea and nausea.       No gerd  Genitourinary:  Negative for dysuria.  Musculoskeletal:  Negative for arthralgias and back pain.  Skin:  Positive for rash (central lower back).  Neurological:  Negative for light-headedness and headaches.  Psychiatric/Behavioral:  Negative for dysphoric mood. The patient is not nervous/anxious.        Objective:   Vitals:   10/05/22 1306  BP: (!) 150/90  Pulse: 74  Temp: 98.4 F (36.9 C)  SpO2: 98%   Filed Weights   10/05/22 1306  Weight: 186 lb (84.4 kg)    Body mass index is 30.48 kg/m.  BP Readings from Last 3 Encounters:  10/05/22 (!) 150/90  06/30/22 (!) 160/92  06/26/21 (!) 142/90    Wt Readings from Last 3 Encounters:  10/05/22 186 lb (84.4 kg)  06/26/21 183 lb (83 kg)  09/13/20 198 lb (89.8 kg)       Physical Exam Constitutional: She appears well-developed and well-nourished. No distress.  HENT:  Head: Normocephalic and atraumatic.  Right Ear: External ear normal. Normal ear canal and TM Left Ear: External ear normal.  Normal ear canal and TM Mouth/Throat: Oropharynx is clear and moist.  Eyes: Conjunctivae normal.  Neck: Neck supple. No tracheal deviation present. No thyromegaly present.  No carotid bruit  Cardiovascular: Normal rate, regular rhythm and normal heart sounds.   No murmur heard.  No edema. Pulmonary/Chest: Effort normal and breath sounds normal. No respiratory distress. She has no wheezes. She has no rales.  Breast: deferred   Abdominal: Soft. She exhibits no distension. There is no tenderness.  Lymphadenopathy: She has no cervical adenopathy.  Skin: Skin is warm and dry. She is not diaphoretic.  Papular rash central lower back approximately 8 inches in length.  No blisters, open wounds or erythema. Psychiatric: She has a normal mood and affect. Her behavior is normal.     Lab  Results  Component Value Date   WBC 8.4 09/13/2020   HGB 14.0 09/13/2020   HCT 41.6 09/13/2020   PLT 290.0 09/13/2020   GLUCOSE 93 09/13/2020   CHOL 210 (H) 09/13/2020   TRIG 109.0 09/13/2020   HDL 48.10 09/13/2020   LDLDIRECT 145.6 01/08/2012   LDLCALC 140 (H) 09/13/2020   ALT 13 09/13/2020   AST 18 09/13/2020   NA 138 09/13/2020   K 3.8 09/13/2020   CL 103 09/13/2020   CREATININE 0.86 09/13/2020   BUN 13 09/13/2020   CO2 29 09/13/2020   TSH 1.31 09/13/2020   HGBA1C 6.0 09/13/2020         Assessment & Plan:   Physical exam: Screening blood work  ordered Exercise  regular - [employer] - goes to gym 2-3  times a week Weight  working on weight loss Substance abuse  none   Reviewed recommended immunizations.   Health Maintenance  Topic Date Due   COVID-19 Vaccine (2 - Pfizer series) 10/21/2022 (Originally 11/02/2020)   Zoster Vaccines- Shingrix (1 of 2) 01/05/2023 (Originally 07/26/2004)   INFLUENZA VACCINE  02/21/2023 (Originally 06/23/2022)   Pneumonia Vaccine 42+ Years old (1 - PCV) 10/06/2023 (Originally 07/27/2019)   TETANUS/TDAP  10/06/2023 (Originally 11/13/2017)   DEXA SCAN  12/24/2023   MAMMOGRAM  07/02/2024   COLONOSCOPY (Pts 45-51yr Insurance coverage will need to be confirmed)  02/19/2028   Hepatitis C Screening  Completed   HPV VACCINES  Aged Out        See Problem List for Assessment and Plan of chronic medical problems.

## 2022-10-05 ENCOUNTER — Ambulatory Visit (INDEPENDENT_AMBULATORY_CARE_PROVIDER_SITE_OTHER): Payer: PRIVATE HEALTH INSURANCE | Admitting: Internal Medicine

## 2022-10-05 ENCOUNTER — Encounter: Payer: Self-pay | Admitting: Internal Medicine

## 2022-10-05 VITALS — BP 140/100 | HR 74 | Temp 98.4°F | Ht 65.5 in | Wt 186.0 lb

## 2022-10-05 DIAGNOSIS — E669 Obesity, unspecified: Secondary | ICD-10-CM

## 2022-10-05 DIAGNOSIS — Z0000 Encounter for general adult medical examination without abnormal findings: Secondary | ICD-10-CM

## 2022-10-05 DIAGNOSIS — R21 Rash and other nonspecific skin eruption: Secondary | ICD-10-CM | POA: Insufficient documentation

## 2022-10-05 DIAGNOSIS — M85861 Other specified disorders of bone density and structure, right lower leg: Secondary | ICD-10-CM | POA: Diagnosis not present

## 2022-10-05 DIAGNOSIS — R7303 Prediabetes: Secondary | ICD-10-CM | POA: Diagnosis not present

## 2022-10-05 DIAGNOSIS — R030 Elevated blood-pressure reading, without diagnosis of hypertension: Secondary | ICD-10-CM | POA: Diagnosis not present

## 2022-10-05 DIAGNOSIS — E559 Vitamin D deficiency, unspecified: Secondary | ICD-10-CM | POA: Diagnosis not present

## 2022-10-05 DIAGNOSIS — E782 Mixed hyperlipidemia: Secondary | ICD-10-CM

## 2022-10-05 LAB — CBC WITH DIFFERENTIAL/PLATELET
Basophils Absolute: 0.1 10*3/uL (ref 0.0–0.1)
Basophils Relative: 1.1 % (ref 0.0–3.0)
Eosinophils Absolute: 0.3 10*3/uL (ref 0.0–0.7)
Eosinophils Relative: 3.3 % (ref 0.0–5.0)
HCT: 41.1 % (ref 36.0–46.0)
Hemoglobin: 13.7 g/dL (ref 12.0–15.0)
Lymphocytes Relative: 30.5 % (ref 12.0–46.0)
Lymphs Abs: 2.9 10*3/uL (ref 0.7–4.0)
MCHC: 33.4 g/dL (ref 30.0–36.0)
MCV: 91.4 fl (ref 78.0–100.0)
Monocytes Absolute: 0.6 10*3/uL (ref 0.1–1.0)
Monocytes Relative: 6.9 % (ref 3.0–12.0)
Neutro Abs: 5.5 10*3/uL (ref 1.4–7.7)
Neutrophils Relative %: 58.2 % (ref 43.0–77.0)
Platelets: 271 10*3/uL (ref 150.0–400.0)
RBC: 4.5 Mil/uL (ref 3.87–5.11)
RDW: 12.8 % (ref 11.5–15.5)
WBC: 9.5 10*3/uL (ref 4.0–10.5)

## 2022-10-05 LAB — COMPREHENSIVE METABOLIC PANEL
ALT: 13 U/L (ref 0–35)
AST: 16 U/L (ref 0–37)
Albumin: 4.2 g/dL (ref 3.5–5.2)
Alkaline Phosphatase: 46 U/L (ref 39–117)
BUN: 13 mg/dL (ref 6–23)
CO2: 29 mEq/L (ref 19–32)
Calcium: 10 mg/dL (ref 8.4–10.5)
Chloride: 101 mEq/L (ref 96–112)
Creatinine, Ser: 0.84 mg/dL (ref 0.40–1.20)
GFR: 71.52 mL/min (ref >60.00)
Glucose, Bld: 89 mg/dL (ref 70–99)
Potassium: 3.8 mEq/L (ref 3.5–5.1)
Sodium: 137 mEq/L (ref 135–145)
Total Bilirubin: 0.5 mg/dL (ref 0.2–1.2)
Total Protein: 7.7 g/dL (ref 6.0–8.3)

## 2022-10-05 LAB — LIPID PANEL
Cholesterol: 229 mg/dL — ABNORMAL HIGH (ref 0–200)
HDL: 54.2 mg/dL (ref >39.00)
LDL Cholesterol: 154 mg/dL — ABNORMAL HIGH (ref 0–99)
NonHDL: 174.44
Total CHOL/HDL Ratio: 4
Triglycerides: 102 mg/dL (ref 0.0–149.0)
VLDL: 20.4 mg/dL (ref 0.0–40.0)

## 2022-10-05 LAB — HEMOGLOBIN A1C: Hgb A1c MFr Bld: 5.8 % (ref 4.6–6.5)

## 2022-10-05 LAB — VITAMIN D 25 HYDROXY (VIT D DEFICIENCY, FRACTURES): VITD: 30.08 ng/mL (ref 30.00–100.00)

## 2022-10-05 LAB — TSH: TSH: 1.7 u[IU]/mL (ref 0.35–5.50)

## 2022-10-05 MED ORDER — TRIAMCINOLONE ACETONIDE 0.1 % EX CREA: 1.0000 | TOPICAL_CREAM | Freq: Two times a day (BID) | CUTANEOUS | 0 refills | Status: DC

## 2022-10-05 NOTE — Assessment & Plan Note (Signed)
Chronic Check lipid panel, cmp, tsh Continue lifestyle control Regular exercise and healthy diet encouraged

## 2022-10-05 NOTE — Assessment & Plan Note (Addendum)
Has had several BP that have been elevated Advised monitoring at home for 2 weeks consistently Discussed risks of uncontrolled BP  Continue healthy lifestyle EKG today shows normal sinus rhythm at 66 bpm, normal EKG.  No change compared to previous EKG from 2013.

## 2022-10-05 NOTE — Assessment & Plan Note (Signed)
Chronic Dexa up to date Continue calcium and vitamin d daily Stressed regular exercise

## 2022-10-05 NOTE — Assessment & Plan Note (Signed)
Chronic Check a1c Low sugar / carb diet Stressed regular exercise  

## 2022-10-05 NOTE — Assessment & Plan Note (Signed)
Chronic Taking vitamin d daily Check vitamin d level  

## 2022-10-05 NOTE — Assessment & Plan Note (Signed)
Acute Rash in the central lower back Would like to see dermatology-referral ordered Has improved with Aquaphor We will prescribe triamcinolone cream-use twice daily for up to 14 days

## 2022-10-05 NOTE — Assessment & Plan Note (Signed)
Discussed importance of weight loss Stressed regular exercise - goal 30 minutes 5 days a week Discussed decreasing portions, decreasing sugars/carbs Increase veges, lean protein, fiber

## 2022-10-09 ENCOUNTER — Encounter: Payer: PRIVATE HEALTH INSURANCE | Admitting: Internal Medicine

## 2023-02-16 DIAGNOSIS — H5213 Myopia, bilateral: Secondary | ICD-10-CM | POA: Diagnosis not present

## 2023-02-16 DIAGNOSIS — H40003 Preglaucoma, unspecified, bilateral: Secondary | ICD-10-CM | POA: Diagnosis not present

## 2023-02-16 DIAGNOSIS — Z961 Presence of intraocular lens: Secondary | ICD-10-CM | POA: Diagnosis not present

## 2023-02-16 DIAGNOSIS — H52203 Unspecified astigmatism, bilateral: Secondary | ICD-10-CM | POA: Diagnosis not present

## 2023-02-16 DIAGNOSIS — H524 Presbyopia: Secondary | ICD-10-CM | POA: Diagnosis not present

## 2023-04-22 ENCOUNTER — Telehealth: Payer: Self-pay | Admitting: Internal Medicine

## 2023-04-22 NOTE — Telephone Encounter (Signed)
Patient called requesting to speak with [facility]. She declined multiple times to say what she needed. She would like a call back at [phone].

## 2023-04-26 NOTE — Telephone Encounter (Signed)
Spoke with patient today and she is faxing form over to be completed.

## 2023-04-26 NOTE — Telephone Encounter (Signed)
Pt was calling back to make sure Albin Felling had received the Fax she sent today.

## 2023-04-29 NOTE — Telephone Encounter (Signed)
Form was faxed on 04/27/23 and conformation received.

## 2023-05-05 NOTE — Telephone Encounter (Signed)
Form has not been received - please refax to :  [phone]  - Attn:  [name]

## 2023-05-05 NOTE — Telephone Encounter (Signed)
Faxed today again to number given and conformation received.

## 2023-05-06 NOTE — Telephone Encounter (Signed)
Patient walked in today and retrieved form

## 2023-05-06 NOTE — Telephone Encounter (Signed)
Patient called and said she would like to pick up the form from the office since there is issue with receiving via fax.

## 2023-08-20 DIAGNOSIS — H34211 Partial retinal artery occlusion, right eye: Secondary | ICD-10-CM | POA: Diagnosis not present

## 2023-08-20 DIAGNOSIS — H40023 Open angle with borderline findings, high risk, bilateral: Secondary | ICD-10-CM | POA: Diagnosis not present

## 2023-08-20 DIAGNOSIS — Z961 Presence of intraocular lens: Secondary | ICD-10-CM | POA: Diagnosis not present

## 2023-08-23 ENCOUNTER — Telehealth: Payer: Self-pay

## 2023-08-24 NOTE — Telephone Encounter (Signed)
Left message for patient to return call to clinic for appointment.  If she calls back please schedule.

## 2023-08-26 NOTE — Patient Instructions (Addendum)
      An Echocardiogram, Ct scan of your heart and ultrasound of your carotid arteries was ordered.  Someone will call you to schedule these appointments.   Bp goal < 130/80  LDL goal < 70   Medications changes include :   none

## 2023-08-26 NOTE — Progress Notes (Signed)
Subjective:    Patient ID: Sheila Solomon, female    DOB: 01-Jul-1956, 67 y.o.   MRN: [id number]      HPI Kendy is here for  Chief Complaint  Patient presents with   Medical Management of Chronic Issues     She saw Dr. 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  He advise getting an echocardiogram and carotid ultrasound  She has had no vision changes.    BP usually elevated in morning and in afternoon /evening it is better.     She is exercising regularly.     Medications and allergies reviewed with patient and updated if appropriate.  Current Outpatient Medications on File Prior to Visit  Medication Sig Dispense Refill   calcium carbonate (OS-CAL) 600 MG TABS tablet Take 600 mg by mouth 2 (two) times daily with a meal.     Cholecalciferol (VITAMIN D3) 2000 UNITS TABS Take 1 tablet by mouth daily.     MAGNESIUM PO Take by mouth.     Multiple Vitamin (MULTIVITAMIN) tablet Take 1 tablet by mouth daily.     POTASSIUM PO Take by mouth.     triamcinolone cream (KENALOG) 0.1 % Apply 1 Application topically 2 (two) times daily. 30 g 0   vitamin C (ASCORBIC ACID) 500 MG tablet Take 500 mg by mouth as needed.     No current facility-administered medications on file prior to visit.    Review of Systems  Eyes:  Negative for visual disturbance.  Respiratory:  Negative for cough, shortness of breath and wheezing.   Cardiovascular:  Negative for chest pain, palpitations and leg swelling.  Neurological:  Negative for dizziness, weakness, light-headedness, numbness and headaches.       Objective:   Vitals:   08/27/23 0748 08/27/23 0834  BP: (!) 146/82 (!) 154/74  Pulse: 86   Temp: 98.6 F (37 C)   SpO2: 98%    BP Readings from Last 3 Encounters:  08/27/23 (!) 154/74  10/05/22 (!) 140/100  06/30/22 (!) 160/92   Wt Readings from Last 3 Encounters:  08/27/23 194 lb (88 kg)  10/05/22 186 lb (84.4 kg)  06/26/21 183 lb (83 kg)    Body mass index is 31.79 kg/m.    Physical Exam Constitutional:      General: She is not in acute distress.    Appearance: Normal appearance.  HENT:     Head: Normocephalic and atraumatic.  Eyes:     Conjunctiva/sclera: Conjunctivae normal.  Neck:     Vascular: No carotid bruit.  Cardiovascular:     Rate and Rhythm: Normal rate and regular rhythm.     Heart sounds: Normal heart sounds.  Pulmonary:     Effort: Pulmonary effort is normal. No respiratory distress.     Breath sounds: Normal breath sounds. No wheezing.  Musculoskeletal:     Cervical back: Neck supple.     Right lower leg: No edema.     Left lower leg: No edema.  Lymphadenopathy:     Cervical: No cervical adenopathy.  Skin:    General: Skin is warm and dry.     Findings: No rash.  Neurological:     Mental Status: She is alert. Mental status is at baseline.  Psychiatric:        Mood and Affect: Mood normal.        Behavior: Behavior normal.        The 10-year ASCVD risk score (Arnett  DK, et al., 2019) is: 17.3%   Values used to calculate the score:     Age: 89 years     Sex: Female     Is Non-Hispanic African American: Yes     Diabetic: No     Tobacco smoker: No     Systolic Blood Pressure: 154 mmHg     Is BP treated: No     HDL Cholesterol: 54.2 mg/dL     Total Cholesterol: 229 mg/dL     Assessment & Plan:    See Problem List for Assessment and Plan of chronic medical problems.    I spent 30 minutes dedicated to the care of this patient on the date of this encounter including review of recent specialist visit, last year's blood work, obtaining history, communicating with the patient with specific focus on discussing test we were going to order, the importance of decreasing risk factors and likely the need for medication, ordering tests, and documenting clinical information in the EHR

## 2023-08-27 ENCOUNTER — Telehealth: Payer: Self-pay | Admitting: Internal Medicine

## 2023-08-27 ENCOUNTER — Ambulatory Visit (INDEPENDENT_AMBULATORY_CARE_PROVIDER_SITE_OTHER): Payer: Medicare Other | Admitting: Internal Medicine

## 2023-08-27 ENCOUNTER — Encounter: Payer: Self-pay | Admitting: Internal Medicine

## 2023-08-27 VITALS — BP 154/74 | HR 86 | Temp 98.6°F | Ht 65.5 in | Wt 194.0 lb

## 2023-08-27 DIAGNOSIS — I708 Atherosclerosis of other arteries: Secondary | ICD-10-CM | POA: Diagnosis not present

## 2023-08-27 DIAGNOSIS — E782 Mixed hyperlipidemia: Secondary | ICD-10-CM | POA: Diagnosis not present

## 2023-08-27 DIAGNOSIS — I10 Essential (primary) hypertension: Secondary | ICD-10-CM

## 2023-08-27 DIAGNOSIS — R030 Elevated blood-pressure reading, without diagnosis of hypertension: Secondary | ICD-10-CM

## 2023-08-27 DIAGNOSIS — E669 Obesity, unspecified: Secondary | ICD-10-CM

## 2023-08-27 DIAGNOSIS — H3509 Other intraretinal microvascular abnormalities: Secondary | ICD-10-CM | POA: Diagnosis not present

## 2023-08-27 DIAGNOSIS — Z136 Encounter for screening for cardiovascular disorders: Secondary | ICD-10-CM

## 2023-08-27 DIAGNOSIS — R7303 Prediabetes: Secondary | ICD-10-CM

## 2023-08-27 LAB — LIPID PANEL
Cholesterol: 215 mg/dL — ABNORMAL HIGH (ref 0–200)
HDL: 54.9 mg/dL (ref >39.00)
LDL Cholesterol: 146 mg/dL — ABNORMAL HIGH (ref 0–99)
NonHDL: 160.26
Total CHOL/HDL Ratio: 4
Triglycerides: 72 mg/dL (ref 0.0–149.0)
VLDL: 14.4 mg/dL (ref 0.0–40.0)

## 2023-08-27 LAB — COMPREHENSIVE METABOLIC PANEL
ALT: 13 U/L (ref 0–35)
AST: 17 U/L (ref 0–37)
Albumin: 4.2 g/dL (ref 3.5–5.2)
Alkaline Phosphatase: 49 U/L (ref 39–117)
BUN: 16 mg/dL (ref 6–23)
CO2: 28 meq/L (ref 19–32)
Calcium: 9.9 mg/dL (ref 8.4–10.5)
Chloride: 104 meq/L (ref 96–112)
Creatinine, Ser: 0.94 mg/dL (ref 0.40–1.20)
GFR: 62.1 mL/min (ref >60.00)
Glucose, Bld: 100 mg/dL — ABNORMAL HIGH (ref 70–99)
Potassium: 4.3 meq/L (ref 3.5–5.1)
Sodium: 140 meq/L (ref 135–145)
Total Bilirubin: 0.7 mg/dL (ref 0.2–1.2)
Total Protein: 7.7 g/dL (ref 6.0–8.3)

## 2023-08-27 LAB — HEMOGLOBIN A1C: Hgb A1c MFr Bld: 5.6 % (ref 4.6–6.5)

## 2023-08-27 NOTE — Assessment & Plan Note (Signed)
Chronic She is exercising regularly - continue  She is eating healthy Decreased portions may help with weight loss

## 2023-08-27 NOTE — Assessment & Plan Note (Signed)
Has had elevated BP here consistently - elevated at home in morning - better in the evening Discussed goal of < 130/80 consistently  Discussed risk of uncontrolled htn  Can work on lifestyle but likely needs medication Monitor BP at home

## 2023-08-27 NOTE — Assessment & Plan Note (Addendum)
Acute Seen recently by Dr. 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

## 2023-08-27 NOTE — Assessment & Plan Note (Signed)
Chronic Lab Results  Component Value Date   HGBA1C 5.8 10/05/2022   Check a1c Low sugar / carb diet Stressed regular exercise

## 2023-08-27 NOTE — Telephone Encounter (Signed)
Patient would like for you to give her a call back - She did not want to say what it was concerning.  Please call her at [phone]

## 2023-08-31 NOTE — Telephone Encounter (Signed)
Message left for patient to return call to clinic.

## 2023-09-13 DIAGNOSIS — H40023 Open angle with borderline findings, high risk, bilateral: Secondary | ICD-10-CM | POA: Diagnosis not present

## 2023-09-13 DIAGNOSIS — H34211 Partial retinal artery occlusion, right eye: Secondary | ICD-10-CM | POA: Diagnosis not present

## 2023-09-13 DIAGNOSIS — Z961 Presence of intraocular lens: Secondary | ICD-10-CM | POA: Diagnosis not present

## 2023-09-20 ENCOUNTER — Ambulatory Visit (HOSPITAL_BASED_OUTPATIENT_CLINIC_OR_DEPARTMENT_OTHER): Payer: Medicare Other

## 2023-09-20 ENCOUNTER — Ambulatory Visit (HOSPITAL_BASED_OUTPATIENT_CLINIC_OR_DEPARTMENT_OTHER)
Admission: RE | Admit: 2023-09-20 | Discharge: 2023-09-20 | Disposition: A | Discharge status: Home / Self Care | Payer: Medicare Other | Source: Ambulatory Visit | Attending: Internal Medicine | Admitting: Internal Medicine

## 2023-09-20 DIAGNOSIS — I080 Rheumatic disorders of both mitral and aortic valves: Secondary | ICD-10-CM | POA: Diagnosis not present

## 2023-09-20 DIAGNOSIS — I517 Cardiomegaly: Secondary | ICD-10-CM

## 2023-09-20 DIAGNOSIS — I708 Atherosclerosis of other arteries: Secondary | ICD-10-CM

## 2023-09-20 DIAGNOSIS — H34219 Partial retinal artery occlusion, unspecified eye: Secondary | ICD-10-CM | POA: Diagnosis not present

## 2023-09-20 DIAGNOSIS — Z136 Encounter for screening for cardiovascular disorders: Secondary | ICD-10-CM | POA: Insufficient documentation

## 2023-09-20 DIAGNOSIS — H3509 Other intraretinal microvascular abnormalities: Secondary | ICD-10-CM | POA: Insufficient documentation

## 2023-09-20 DIAGNOSIS — I77810 Thoracic aortic ectasia: Secondary | ICD-10-CM | POA: Diagnosis not present

## 2023-09-20 LAB — ECHOCARDIOGRAM COMPLETE
Area-P 1/2: 3.03 cm2
S' Lateral: 2.31 cm

## 2023-09-23 ENCOUNTER — Telehealth: Payer: Self-pay | Admitting: Internal Medicine

## 2023-09-23 NOTE — Telephone Encounter (Signed)
Message left for patient to return call to clinic for patient to come in and see Dr. Lawerance Bach for results.   If she calls back please schedule appointment as indicated by Dr. Lawerance Bach.

## 2023-09-23 NOTE — Telephone Encounter (Signed)
Please call her-she just had a CT scan of her coronary arteries and an echocardiogram of her heart.  I think it might be easier to have her come in for a visit so we can discuss these in person.

## 2023-09-27 NOTE — Telephone Encounter (Signed)
Please call.

## 2023-09-29 ENCOUNTER — Encounter: Payer: Self-pay | Admitting: Internal Medicine

## 2023-09-29 DIAGNOSIS — I517 Cardiomegaly: Secondary | ICD-10-CM | POA: Insufficient documentation

## 2023-09-29 DIAGNOSIS — I2510 Atherosclerotic heart disease of native coronary artery without angina pectoris: Secondary | ICD-10-CM | POA: Insufficient documentation

## 2023-09-29 NOTE — Progress Notes (Unsigned)
Virtual Visit via Video Note  I connected with Sheila Solomon on 09/29/23 at  8:10 AM EST by a video enabled telemedicine application and verified that I am speaking with the correct person using two identifiers.   I discussed the limitations of evaluation and management by telemedicine and the availability of in person appointments. The patient expressed understanding and agreed to proceed.  Present for the visit:  Myself, Dr Cheryll Cockayne, [name].  The patient is currently at home and I am in the office.    No referring provider.    History of Present Illness: This visit is for follow-up of recent tests.  Carotid ultrasound: Homogenous plaque bilateral, no significant calcifications or stenosis  Echo: EF 60-65%, no LV regional wall motion abnormalities.  Moderate asymmetric LVH of the basal-septal segment.  Grade 1 DD.  RV normal.  Trivial MR.  Atrial valve sclerosis is present.  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  CT CAC score: 411-plaque only seen in left circumflex artery.  This was 93rd percentile for age, race and sex.  Mild aortic atherosclerosis.  [It was noted that the left circumflex calcification appeared to be in the coronary however could have possibly mitral valve annular calcification]    BP at home 140's  140/79, 141/84   Social History   Socioeconomic History   Marital status: Unknown    Spouse name: Not on file   Number of children: Not on file   Years of education: Not on file   Highest education level: Not on file  Occupational History   Not on file  Tobacco Use   Smoking status: Never   Smokeless tobacco: Never  Substance and Sexual Activity   Alcohol use: No   Drug use: No   Sexual activity: Not on file  Other Topics Concern   Not on file  Social History Narrative   Exercise: 1 day a week   Social Determinants of Health   Financial Resource Strain: Not on file  Food Insecurity: Not on file  Transportation Needs: Not on file   Physical Activity: Not on file  Stress: Not on file  Social Connections: Not on file     Observations/Objective: Appears well in NAD   US Carotid Bilateral CLINICAL DATA:  67 year 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  COMPARISON:  None Available.  FINDINGS: Criteria: Quantification of carotid stenosis is based on velocity parameters that correlate the residual internal carotid diameter with NASCET-based stenosis levels, using the diameter of the distal internal carotid lumen as the denominator for stenosis measurement.  The following velocity measurements were obtained:  RIGHT  ICA:  Systolic 43 cm/sec, Diastolic 15 cm/sec  CCA:  72 cm/sec  SYSTOLIC ICA/CCA RATIO:  0.6  ECA:  36 cm/sec  LEFT  ICA:  Systolic 34 cm/sec, Diastolic 15 cm/sec  CCA:  68 cm/sec  SYSTOLIC ICA/CCA RATIO:  0.5  ECA:  54 cm/sec  Right Brachial SBP: Not acquired  Left Brachial SBP: Not acquired  RIGHT CAROTID ARTERY: No significant calcified disease of the right common carotid artery. Intermediate waveform maintained. Homogeneous plaque without significant calcifications at the right carotid bifurcation. Low resistance waveform of the right ICA. No significant tortuosity.  RIGHT VERTEBRAL ARTERY: Antegrade flow with low resistance waveform.  LEFT CAROTID ARTERY: No significant calcified disease of the left common carotid artery.  Intermediate waveform maintained. Homogeneous plaque at the left carotid bifurcation without significant calcifications. Low resistance waveform of the left ICA.  LEFT VERTEBRAL ARTERY:  Antegrade flow with low resistance waveform.  IMPRESSION: Color duplex indicates minimal homogeneous plaque, with no hemodynamically significant stenosis by duplex criteria in  the extracranial cerebrovascular circulation.  Signed,  Yvone Neu. Miachel Roux, RPVI  [facility]  [facility]  Electronically Signed   By: Gilmer Mor D.O.   On: 09/23/2023 08:49   Lab Results  Component Value Date   LDLCALC 146 (H) 08/27/2023   BP Readings from Last 3 Encounters:  08/27/23 (!) 154/74  10/05/22 (!) 140/100  06/30/22 (!) 160/92     Assessment and Plan:  See Problem List for Assessment and Plan of chronic medical problems.   I spent 22 minutes dedicated to the care of this patient on the date of this encounter including review of lab blood work labs and imaging,communicating with the patient, ordering referral,  and documenting clinical information in the EHR   Follow Up Instructions:    I discussed the assessment and treatment plan with the patient. The patient was provided an opportunity to ask questions and all were answered. The patient agreed with the plan and demonstrated an understanding of the instructions.   The patient was advised to call back or seek an in-person evaluation if the symptoms worsen or if the condition fails to improve as anticipated.    Pincus Sanes, MD

## 2023-09-29 NOTE — Assessment & Plan Note (Addendum)
08/2023: CT CAC score 411-plaque in left circumflex artery Reviewed test with her Advised starting statin She would like to work on lifestyle first Aspirin 81 mg daily She is in agreement to seeing cardiology to see if further evaluation is necessary She will work on lifestyle and we can recheck cholesterol in a few months

## 2023-09-30 ENCOUNTER — Telehealth: Payer: Medicare Other | Admitting: Internal Medicine

## 2023-09-30 DIAGNOSIS — I10 Essential (primary) hypertension: Secondary | ICD-10-CM | POA: Diagnosis not present

## 2023-09-30 DIAGNOSIS — I517 Cardiomegaly: Secondary | ICD-10-CM | POA: Diagnosis not present

## 2023-09-30 DIAGNOSIS — E782 Mixed hyperlipidemia: Secondary | ICD-10-CM | POA: Diagnosis not present

## 2023-09-30 DIAGNOSIS — I2510 Atherosclerotic heart disease of native coronary artery without angina pectoris: Secondary | ICD-10-CM | POA: Diagnosis not present

## 2023-09-30 DIAGNOSIS — I77810 Thoracic aortic ectasia: Secondary | ICD-10-CM | POA: Insufficient documentation

## 2023-09-30 DIAGNOSIS — H3509 Other intraretinal microvascular abnormalities: Secondary | ICD-10-CM

## 2023-09-30 NOTE — Assessment & Plan Note (Addendum)
Chronic Not on medication-does not want to start one at this time SBP at home consistently in the 140s-discussed goal of less than 130/80 Discussed echo that shows LVH and that her blood pressure has caused her heart muscle become thickened and the importance of starting medication Would like to work on lifestyle Continue to monitor BP Follow-up early 2025

## 2023-09-30 NOTE — Assessment & Plan Note (Signed)
Chronic LDL above goal-discussed goal of less than 70 given newly diagnosed CAD Advised starting a statin She would like to work on lifestyle first Will plan on checking lipids early 2025

## 2023-09-30 NOTE — Assessment & Plan Note (Signed)
New Seen on recent echocardiogram Related to uncontrolled hypertension Not on medication-would like to work on lifestyle before considering medication Stressed getting BP controlled-goal less than 130/80

## 2023-12-07 DIAGNOSIS — H5213 Myopia, bilateral: Secondary | ICD-10-CM | POA: Diagnosis not present

## 2023-12-07 DIAGNOSIS — H43813 Vitreous degeneration, bilateral: Secondary | ICD-10-CM | POA: Diagnosis not present

## 2023-12-07 DIAGNOSIS — H40023 Open angle with borderline findings, high risk, bilateral: Secondary | ICD-10-CM | POA: Diagnosis not present

## 2024-02-24 ENCOUNTER — Ambulatory Visit: Payer: Medicare Other

## 2024-02-24 VITALS — Ht 66.0 in | Wt 187.0 lb

## 2024-02-24 DIAGNOSIS — Z0000 Encounter for general adult medical examination without abnormal findings: Secondary | ICD-10-CM | POA: Diagnosis not present

## 2024-02-24 DIAGNOSIS — Z1231 Encounter for screening mammogram for malignant neoplasm of breast: Secondary | ICD-10-CM | POA: Diagnosis not present

## 2024-02-24 DIAGNOSIS — M8580 Other specified disorders of bone density and structure, unspecified site: Secondary | ICD-10-CM

## 2024-02-24 NOTE — Patient Instructions (Addendum)
 Sheila Solomon , Thank you for taking time to come for your Medicare Wellness Visit. I appreciate your ongoing commitment to your health goals. Please review the following plan we discussed and let me know if I can assist you in the future.   Referrals/Orders/Follow-Ups/Clinician Recommendations: Aim for 30 minutes of exercise or brisk walking, 6-8 glasses of water, and 5 servings of fruits and vegetables each day.   This is a list of the screening recommended for you and due dates:  Health Maintenance  Topic Date Due   Pneumonia Vaccine (1 of 2 - PCV) Never done   Zoster (Shingles) Vaccine (1 of 2) Never done   DTaP/Tdap/Td vaccine (2 - Tdap) 11/13/2017   COVID-19 Vaccine (2 - 2024-25 season) 07/25/2023   DEXA scan (bone density measurement)  12/24/2023   Flu Shot  06/23/2024   Mammogram  07/02/2024   Medicare Annual Wellness Visit  02/23/2025   Colon Cancer Screening  02/19/2028   Hepatitis C Screening  Completed   HPV Vaccine  Aged Out    Advanced directives: (Copy Requested) Please bring a copy of your health care power of attorney and living will to the office to be added to your chart at your convenience. You can mail to [facility] [address] [place], [place] [address] or email to [email or web address]  Next Medicare Annual Wellness Visit scheduled for next year: Yes

## 2024-02-24 NOTE — Progress Notes (Signed)
 Subjective:   Sheila Solomon is a 68 y.o. who presents for a Medicare Wellness preventive visit.  Visit Complete: Virtual I connected with  Sheila Solomon on 02/24/24 by a audio enabled telemedicine application and verified that I am speaking with the correct person using two identifiers.  Patient Location: Home  Provider Location: Office/Clinic  I discussed the limitations of evaluation and management by telemedicine. The patient expressed understanding and agreed to proceed.  Vital Signs: Because this visit was a virtual/telehealth visit, some criteria may be missing or patient reported. Any vitals not documented were not able to be obtained and vitals that have been documented are patient reported.  VideoError- Librarian, academic were attempted between this provider and patient, however failed, due to patient having technical difficulties OR patient did not have access to video capability.  We continued and completed visit with audio only.   Persons Participating in Visit: Patient.  AWV Questionnaire: No: Patient Medicare AWV questionnaire was not completed prior to this visit.  Cardiac Risk Factors include: advanced age (>53men, >72 women);dyslipidemia;hypertension;obesity (BMI >30kg/m2)     Objective:    Today's Vitals   02/24/24 0905  Weight: 187 lb (84.8 kg)  Height: 5\' 6"  (1.676 m)   Body mass index is 30.18 kg/m.     02/24/2024    9:03 AM 03/09/2018    8:17 AM  Advanced Directives  Does Patient Have a Medical Advance Directive? Yes Yes  Type of Estate agent of [place];Living will   Does patient want to make changes to medical advance directive?  No - Patient declined  Copy of Healthcare Power of Attorney in Chart? No - copy requested     Current Medications (verified) Outpatient Encounter Medications as of 02/24/2024  Medication Sig   calcium carbonate (OS-CAL) 600 MG TABS tablet Take 600 mg by mouth 2 (two)  times daily with a meal.   Cholecalciferol (VITAMIN D3) 2000 UNITS TABS Take 1 tablet by mouth daily.   MAGNESIUM PO Take by mouth.   Multiple Vitamin (MULTIVITAMIN) tablet Take 1 tablet by mouth daily.   POTASSIUM PO Take by mouth.   vitamin C (ASCORBIC ACID) 500 MG tablet Take 500 mg by mouth as needed.   [DISCONTINUED] triamcinolone cream (KENALOG) 0.1 % Apply 1 Application topically 2 (two) times daily.   aspirin EC 81 MG tablet Take 81 mg by mouth daily. Swallow whole. (Patient not taking: Reported on 02/24/2024)   No facility-administered encounter medications on file as of 02/24/2024.    Allergies (verified) Prednisone   History: Past Medical History:  Diagnosis Date   Cataract    Hyperlipidemia    Osteopenia    [place] [facility]   Vitamin D deficiency    Past Surgical History:  Procedure Laterality Date   CATARACT EXTRACTION     15 years ago   COLONOSCOPY  2012    [facility];negative; due 2022   G 1 P 1     C section   TOTAL ABDOMINAL HYSTERECTOMY     fibroids & dysfunctional menses   Family History  Problem Relation Age of Onset   Hypertension Mother    Coronary artery disease Mother        4 v CBAG in 77s   Hypertension Father    Diabetes Father    Other Father        elevated PSA   Dementia Father    Hypertension Sister    Hypertension Brother  Diabetes Brother    Heart disease Brother    Stroke Paternal Grandmother        in 39s   Colon cancer Neg Hx    Colon polyps Neg Hx    Esophageal cancer Neg Hx    Stomach cancer Neg Hx    Rectal cancer Neg Hx    Social History   Socioeconomic History   Marital status: Divorced    Spouse name: Not on file   Number of children: 1   Years of education: Not on file   Highest education level: Not on file  Occupational History   Not on file  Tobacco Use   Smoking status: Never    Passive exposure: Never   Smokeless tobacco: Never  Vaping Use   Vaping status: Not on file  Substance and Sexual Activity    Alcohol use: No   Drug use: No   Sexual activity: Not on file  Other Topics Concern   Not on file  Social History Narrative   Exercise: 1 day a week   Social Drivers of Health   Financial Resource Strain: Low Risk  (02/24/2024)   Overall Financial Resource Strain (CARDIA)    Difficulty of Paying Living Expenses: Not hard at all  Food Insecurity: No Food Insecurity (02/24/2024)   Hunger Vital Sign    Worried About Running Out of Food in the Last Year: Never true    Ran Out of Food in the Last Year: Never true  Transportation Needs: No Transportation Needs (02/24/2024)   PRAPARE - Administrator, Civil Service (Medical): No    Lack of Transportation (Non-Medical): No  Physical Activity: Sufficiently Active (02/24/2024)   Exercise Vital Sign    Days of Exercise per Week: 4 days    Minutes of Exercise per Session: 60 min  Stress: No Stress Concern Present (02/24/2024)   [employer] of Occupational Health - Occupational Stress Questionnaire    Feeling of Stress : Not at all  Social Connections: Moderately Integrated (02/24/2024)   Social Connection and Isolation Panel [NHANES]    Frequency of Communication with Friends and Family: More than three times a week    Frequency of Social Gatherings with Friends and Family: More than three times a week    Attends Religious Services: More than 4 times per year    Active Member of [employer] or Organizations: Yes    Attends Engineer, structural: More than 4 times per year    Marital Status: Divorced    Tobacco Counseling Counseling given: No    Clinical Intake:  Pre-visit preparation completed: Yes  Pain : No/denies pain     BMI - recorded: 30.18 Nutritional Risks: None Diabetes: No  Lab Results  Component Value Date   HGBA1C 5.6 08/27/2023   HGBA1C 5.8 10/05/2022   HGBA1C 6.0 09/13/2020     How often do you need to have someone help you when you read instructions, pamphlets, or other written materials from  your doctor or pharmacy?: 1 - Never  Interpreter Needed?: No  Information entered by :: Hassell Halim, CMA   Activities of Daily Living     02/24/2024    9:08 AM  In your present state of health, do you have any difficulty performing the following activities:  Hearing? 0  Vision? 0  Difficulty concentrating or making decisions? 0  Walking or climbing stairs? 0  Dressing or bathing? 0  Doing errands, shopping? 0  Preparing Food and eating ?  N  Using the Toilet? N  In the past six months, have you accidently leaked urine? N  Do you have problems with loss of bowel control? N  Managing your Medications? N  Managing your Finances? N  Housekeeping or managing your Housekeeping? N    Patient Care Team: Pincus Sanes, MD as PCP - General (Internal Medicine)  Indicate any recent Medical Services you may have received from other than [facility] providers in the past year (date may be approximate).     Assessment:   This is a routine wellness examination for Sheila Solomon.  Hearing/Vision screen Hearing Screening - Comments:: Denies hearing difficulties   Vision Screening - Comments:: Wears eyeglasses for reading only- up to date with routine eye exams with  Dr Harlon Flor   Goals Addressed               This Visit's Progress     Patient Stated (pt-stated)        Patient stated that she plans to continue living a healthy lifestyle.       Depression Screen     02/24/2024    9:13 AM 10/05/2022    1:12 PM 09/13/2020    9:14 AM 09/08/2019    9:18 AM 03/09/2018    8:17 AM  PHQ 2/9 Scores  PHQ - 2 Score 0 0 0 0 0  PHQ- 9 Score 0 0       Fall Risk     02/24/2024    9:09 AM 10/05/2022    1:12 PM 09/13/2020    9:11 AM 09/08/2019    9:18 AM 03/09/2018    8:17 AM  Fall Risk   Falls in the past year? 0 0 0 0 No  Number falls in past yr: 0 0 0 0   Injury with Fall? 0 0 0    Risk for fall due to : No Fall Risks No Fall Risks     Follow up Falls prevention discussed;Falls evaluation  completed Falls evaluation completed Falls evaluation completed      MEDICARE RISK AT HOME:  Medicare Risk at Home Any stairs in or around the home?: Yes If so, are there any without handrails?: No Home free of loose throw rugs in walkways, pet beds, electrical cords, etc?: Yes Adequate lighting in your home to reduce risk of falls?: Yes Life alert?: Yes (alarm system) Use of a cane, walker or w/c?: No Grab bars in the bathroom?: Yes Shower chair or bench in shower?: Yes Elevated toilet seat or a handicapped toilet?: No  TIMED UP AND GO:  Was the test performed?  No  Cognitive Function: 6CIT completed        02/24/2024    9:10 AM  6CIT Screen  What Year? 0 points  What month? 0 points  What time? 0 points  Count back from 20 0 points  Months in reverse 0 points  Repeat phrase 0 points  Total Score 0 points    Immunizations Immunization History  Administered Date(s) Administered   Influenza-Unspecified 08/23/2016, 08/23/2017   PFIZER(Purple Top)SARS-COV-2 Vaccination 09/07/2020   Td 11/14/2007    Screening Tests Health Maintenance  Topic Date Due   Pneumonia Vaccine 68+ Years old (1 of 2 - PCV) Never done   Zoster Vaccines- Shingrix (1 of 2) Never done   DTaP/Tdap/Td (2 - Tdap) 11/13/2017   COVID-19 Vaccine (2 - 2024-25 season) 07/25/2023   DEXA SCAN  12/24/2023   INFLUENZA VACCINE  06/23/2024   MAMMOGRAM  07/02/2024   Medicare Annual Wellness (AWV)  02/23/2025   Colonoscopy  02/19/2028   Hepatitis C Screening  Completed   HPV VACCINES  Aged Out    Health Maintenance  Health Maintenance Due  Topic Date Due   Pneumonia Vaccine 47+ Years old (1 of 2 - PCV) Never done   Zoster Vaccines- Shingrix (1 of 2) Never done   DTaP/Tdap/Td (2 - Tdap) 11/13/2017   COVID-19 Vaccine (2 - 2024-25 season) 07/25/2023   DEXA SCAN  12/24/2023   Health Maintenance Items Addressed: Mammogram ordered, DEXA ordered  Additional Screening:  Vision Screening: Recommended  annual ophthalmology exams for early detection of glaucoma and other disorders of the eye.  Dental Screening: Recommended annual dental exams for proper oral hygiene  Community Resource Referral / Chronic Care Management: CRR required this visit?  No   CCM required this visit?  No     Plan:     I have personally reviewed and noted the following in the patient's chart:   Medical and social history Use of alcohol, tobacco or illicit drugs  Current medications and supplements including opioid prescriptions. Patient is not currently taking opioid prescriptions. Functional ability and status Nutritional status Physical activity Advanced directives List of other physicians Hospitalizations, surgeries, and ER visits in previous 12 months Vitals Screenings to include cognitive, depression, and falls Referrals and appointments  In addition, I have reviewed and discussed with patient certain preventive protocols, quality metrics, and best practice recommendations. A written personalized care plan for preventive services as well as general preventive health recommendations were provided to patient.     Darreld Mclean, CMA   02/24/2024   After Visit Summary: (MyChart) Due to this being a telephonic visit, the after visit summary with patients personalized plan was offered to patient via MyChart   Notes: Nothing significant to report at this time.

## 2024-03-17 ENCOUNTER — Ambulatory Visit (INDEPENDENT_AMBULATORY_CARE_PROVIDER_SITE_OTHER): Payer: Medicare Other | Admitting: Cardiovascular Disease

## 2024-03-17 ENCOUNTER — Encounter (HOSPITAL_BASED_OUTPATIENT_CLINIC_OR_DEPARTMENT_OTHER): Payer: Self-pay | Admitting: Cardiovascular Disease

## 2024-03-17 VITALS — BP 148/92 | HR 76 | Ht 66.0 in | Wt 194.0 lb

## 2024-03-17 DIAGNOSIS — R7303 Prediabetes: Secondary | ICD-10-CM | POA: Diagnosis not present

## 2024-03-17 DIAGNOSIS — E782 Mixed hyperlipidemia: Secondary | ICD-10-CM

## 2024-03-17 DIAGNOSIS — I10 Essential (primary) hypertension: Secondary | ICD-10-CM

## 2024-03-17 DIAGNOSIS — I77810 Thoracic aortic ectasia: Secondary | ICD-10-CM

## 2024-03-17 DIAGNOSIS — I2510 Atherosclerotic heart disease of native coronary artery without angina pectoris: Secondary | ICD-10-CM

## 2024-03-17 DIAGNOSIS — I517 Cardiomegaly: Secondary | ICD-10-CM

## 2024-03-17 NOTE — Progress Notes (Signed)
 Cardiology Office Note:  .   Date:  03/17/2024  ID:  Sheila Solomon, DOB [birth year], MRN [id number] PCP: Sheila Dauphin, MD  [facility] Providers Cardiologist:  None    History of Present Illness: .    Sheila Solomon is a 68 y.o. female with coronary calcification, hypertension,, and hyperlipidemia who is being seen today for the evaluation of coronary calcification at the request of Sheila Dauphin, MD.  Sheila Solomon had a coronary calcium score 08/2023 that revealed a score of 411.  This was 93rd percentile for age and gender.  There was some question whether this was anterior mitral valve annulus calcification versus left circumflex plaque, as there was significant calcification of the posterior mitral valve annulus.  History of Present Illness Sheila Solomon has a history of hypertension with home blood pressure readings typically in the 130s, but noted a reading of 158 mmHg in the office today. She monitors her blood pressure at home and has observed fluctuations. She is not on a weight management program or taking medications for weight loss but is conscious of her diet and exercise.  She is on a path of wellness, exercising four days a week to manage stress and improve her health. She has been dealing with personal stressors, including the loss of her mother a year ago and managing the sale of her mother's house. She is focused on maintaining her health as she approaches her 68th birthday.  Her dietary habits include eating out frequently due to a busy schedule, but she is making efforts to choose healthier options and is considering meal prepping. She has an upcoming appointment with a dietitian to assist with meal planning. She acknowledges a need to improve her diet, particularly in reducing sodium intake.  There is no family history of heart attacks or strokes. Her father died of double pneumonia, and her mother had heart issues and underwent quadruple bypass surgery approximately  30 years ago. Both parents and her siblings have a history of high blood pressure. She is a non-smoker and drinks wine occasionally.  She had a series of tests, including an EKG in late 2023, and is sensitive to medical costs, preferring not to repeat tests unnecessarily. No history of heart attacks, strokes, chronic kidney disease, or dialysis.  ROS:  As per HPI  Studies Reviewed: Aaron Aas   EKG Interpretation Date/Time:  Friday March 17 2024 08:56:09 EDT Ventricular Rate:  76 PR Interval:  200 QRS Duration:  82 QT Interval:  392 QTC Calculation: 441 R Axis:   -22  Text Interpretation: Normal sinus rhythm Low voltage QRS When compared with ECG of 24-Feb-1999 09:04, Criteria for Septal infarct are no longer Present Confirmed by Maudine Sos ([id number]) on 03/17/2024 9:08:00 AM   Coronary calcium score 08/2023: IMPRESSION: 1. Coronary calcium score of 411. This was 93rd percentile for age-, race-, and sex-matched controls.   2.  Mild aortic atherosclerosis.   3. The LCX calcification appears to be in the coronary. However, cannot exclude that this is mitral annular calcification.  Echo 08/2023: 1. Left ventricular ejection fraction, by estimation, is 60 to 65%. Left  ventricular ejection fraction by 3D volume is 65 %. The left ventricle has  normal function. The left ventricle has no regional wall motion  abnormalities. There is moderate  asymmetric left ventricular hypertrophy of the basal-septal segment. Left  ventricular diastolic parameters are consistent with Grade I diastolic  dysfunction (impaired relaxation).   2. Right ventricular systolic function is  normal. The right ventricular  size is normal. Tricuspid regurgitation signal is inadequate for assessing  PA pressure.   3. The mitral valve is abnormal. Trivial mitral valve regurgitation.   4. The aortic valve is tricuspid. Aortic valve regurgitation is not  visualized. Aortic valve sclerosis/calcification is present,  without any  evidence of aortic stenosis.   5. Aortic dilatation noted. There is borderline dilatation of the  ascending aorta, measuring 38 mm.   6. The inferior vena cava is normal in size with greater than 50%  respiratory variability, suggesting right atrial pressure of 3 mmHg.  Risk Assessment/Calculations:     HYPERTENSION CONTROL Vitals:   03/17/24 0839 03/17/24 0921  BP: (!) 152/98 (!) 148/92    The patient's blood pressure is elevated above target today.  In order to address the patient's elevated BP: Blood pressure will be monitored at home to determine if medication changes need to be made.          Physical Exam:   VS:  BP (!) 148/92   Pulse 76   Ht 5\' 6"  (1.676 m)   Wt 194 lb (88 kg)   SpO2 97%   BMI 31.31 kg/m  , BMI Body mass index is 31.31 kg/m. GENERAL:  Well appearing HEENT: Pupils equal round and reactive, fundi not visualized, oral mucosa unremarkable NECK:  No jugular venous distention, waveform within normal limits, carotid upstroke brisk and symmetric, no bruits, no thyromegaly LUNGS:  Clear to auscultation bilaterally HEART:  RRR.  PMI not displaced or sustained,S1 and S2 within normal limits, no S3, no S4, no clicks, no rubs, no murmurs ABD:  Flat, positive bowel sounds normal in frequency in pitch, no bruits, no rebound, no guarding, no midline pulsatile mass, no hepatomegaly, no splenomegaly EXT:  2 plus pulses throughout, no edema, no cyanosis no clubbing SKIN:  No rashes no nodules NEURO:  Cranial nerves II through XII grossly intact, motor grossly intact throughout PSYCH:  Cognitively intact, oriented to person place and time   ASSESSMENT AND PLAN: .    Assessment & Plan # Hypertension Home blood pressure readings suggest possible white coat hypertension but typically 120s-130s.  Target is under 130/80 mmHg to reduce cardiovascular risk. - Track blood pressure closely at home. - Reassess blood pressure in a few months with home monitoring  data. - Encourage continued lifestyle modifications including exercise and dietary changes. - OK to continue holding antihypertensives for now.    # Coronary artery calcification CT scan shows left coronary artery calcification, indicating plaque presence. Aggressive management of cardiovascular risk factors is necessary to prevent progression. - Focus on lifestyle modifications to manage cardiovascular risk factors. - Reassess cardiovascular risk factors in 3-6 months.  # Hyperlipidemia LDL cholesterol levels are in the 140s-150s mg/dL. Coronary artery calcification necessitates lowering LDL ideally under 70 mg/dL. She prefers dietary changes over statin therapy. - Encourage dietary changes to lower cholesterol, including limiting animal products and considering more plant-based meals. - Reassess cholesterol levels in 3-6 months. - Discuss potential need for medication if lifestyle changes do not achieve desired LDL levels.  # Aortic valve calcification Echocardiogram shows aortic valve calcification without significant obstruction or symptoms. No immediate intervention required. - Monitor for any changes in symptoms or condition.  # Follow-up Plan to reassess blood pressure and cholesterol levels after lifestyle modifications. - Schedule follow-up appointment in 4 months. - Recheck fasting labs one week before the follow-up appointment. - Bring home blood pressure monitoring data to the follow-up  appointment.   Dispo: f/u 4 months  Signed, Maudine Sos, MD

## 2024-03-17 NOTE — Patient Instructions (Signed)
 Medication Instructions:  Your physician recommends that you continue on your current medications as directed. Please refer to the Current Medication list given to you today.  *If you need a refill on your cardiac medications before your next appointment, please call your pharmacy*  Lab Work: FASTING LP/CMET ABOUT A WEEK PRIOR TO FOLLOW UP   If you have labs (blood work) drawn today and your tests are completely normal, you will receive your results only by: MyChart Message (if you have MyChart) OR A paper copy in the mail If you have any lab test that is abnormal or we need to change your treatment, we will call you to review the results.  Testing/Procedures: NONE  Follow-Up: At [facility], you and your health needs are our priority.  As part of our continuing mission to provide you with exceptional heart care, our providers are all part of one team.  This team includes your primary Cardiologist (physician) and Advanced Practice Providers or APPs (Physician Assistants and Nurse Practitioners) who all work together to provide you with the care you need, when you need it.  Your next appointment:   4 month(s)  Provider:   Maudine Sos, MD, Slater Duncan, NP, or Neomi Banks, NP     We recommend signing up for the patient portal called "MyChart".  Sign up information is provided on this After Visit Summary.  MyChart is used to connect with patients for Virtual Visits (Telemedicine).  Patients are able to view lab/test results, encounter notes, upcoming appointments, etc.  Non-urgent messages can be sent to your provider as well.   To learn more about what you can do with MyChart, go to [email or web address].   Other Instructions CONTINUE TO EXERCISE   WORK HARDER ON DIET AND MEAL PREP   MONITOR AND LOG YOUR BLOOD PRESSURE. BRING YOUR READINGS AND MACHINE TO FOLLOW UP

## 2024-04-10 ENCOUNTER — Other Ambulatory Visit: Payer: Self-pay | Admitting: Internal Medicine

## 2024-04-10 DIAGNOSIS — Z1231 Encounter for screening mammogram for malignant neoplasm of breast: Secondary | ICD-10-CM

## 2024-04-12 ENCOUNTER — Telehealth: Payer: Self-pay | Admitting: Internal Medicine

## 2024-04-12 NOTE — Telephone Encounter (Signed)
 Copied from CRM [id number]. Topic: Appointments - Scheduling Inquiry for Clinic >> Apr 12, 2024  8:13 AM Sheila Solomon wrote: Reason for CRM: Patient's mammogram has not been scheduled. Patient states that she spoke with Dr Donnette Gal' nurse and ask that the mammogram be scheduled. Callback number is [phone]

## 2024-04-13 NOTE — Telephone Encounter (Signed)
 Spoke with patient and she has been scheduled already.

## 2024-04-13 NOTE — Telephone Encounter (Signed)
 Copied from CRM [id number]. Topic: Appointments - Scheduling Inquiry for Clinic >> Apr 13, 2024 10:21 AM Chuck Crater wrote: Patient is requesting a callback from nurse in regards to message.

## 2024-04-14 ENCOUNTER — Ambulatory Visit
Admission: RE | Admit: 2024-04-14 | Discharge: 2024-04-14 | Disposition: A | Discharge status: Home / Self Care | Source: Ambulatory Visit

## 2024-04-14 DIAGNOSIS — Z1231 Encounter for screening mammogram for malignant neoplasm of breast: Secondary | ICD-10-CM

## 2024-07-18 ENCOUNTER — Ambulatory Visit (INDEPENDENT_AMBULATORY_CARE_PROVIDER_SITE_OTHER)
Admission: RE | Admit: 2024-07-18 | Discharge: 2024-07-18 | Disposition: A | Discharge status: Home / Self Care | Source: Ambulatory Visit | Attending: Internal Medicine | Admitting: Internal Medicine

## 2024-07-18 DIAGNOSIS — M8580 Other specified disorders of bone density and structure, unspecified site: Secondary | ICD-10-CM

## 2024-07-20 ENCOUNTER — Ambulatory Visit: Payer: Self-pay | Admitting: Internal Medicine

## 2024-07-20 DIAGNOSIS — M85861 Other specified disorders of bone density and structure, right lower leg: Secondary | ICD-10-CM

## 2024-08-07 ENCOUNTER — Ambulatory Visit (HOSPITAL_BASED_OUTPATIENT_CLINIC_OR_DEPARTMENT_OTHER): Admitting: Cardiovascular Disease

## 2024-09-21 DIAGNOSIS — H43813 Vitreous degeneration, bilateral: Secondary | ICD-10-CM | POA: Diagnosis not present

## 2024-09-21 DIAGNOSIS — H40023 Open angle with borderline findings, high risk, bilateral: Secondary | ICD-10-CM | POA: Diagnosis not present

## 2024-09-21 DIAGNOSIS — H5213 Myopia, bilateral: Secondary | ICD-10-CM | POA: Diagnosis not present

## 2024-09-29 ENCOUNTER — Encounter: Admitting: Internal Medicine

## 2024-10-01 ENCOUNTER — Encounter: Payer: Self-pay | Admitting: Internal Medicine

## 2024-10-01 NOTE — Patient Instructions (Incomplete)
 Blood work was ordered.       Medications changes include :   None    A referral was ordered and someone will call you to schedule an appointment.     No follow-ups on file.   Health Maintenance, Female Adopting a healthy lifestyle and getting preventive care are important in promoting health and wellness. Ask your health care provider about: The right schedule for you to have regular tests and exams. Things you can do on your own to prevent diseases and keep yourself healthy. What should I know about diet, weight, and exercise? Eat a healthy diet  Eat a diet that includes plenty of vegetables, fruits, low-fat dairy products, and lean protein. Do not eat a lot of foods that are high in solid fats, added sugars, or sodium. Maintain a healthy weight Body mass index (BMI) is used to identify weight problems. It estimates body fat based on height and weight. Your health care provider can help determine your BMI and help you achieve or maintain a healthy weight. Get regular exercise Get regular exercise. This is one of the most important things you can do for your health. Most adults should: Exercise for at least 150 minutes each week. The exercise should increase your heart rate and make you sweat (moderate-intensity exercise). Do strengthening exercises at least twice a week. This is in addition to the moderate-intensity exercise. Spend less time sitting. Even light physical activity can be beneficial. Watch cholesterol and blood lipids Have your blood tested for lipids and cholesterol at 70 years of age, then have this test every 5 years. Have your cholesterol levels checked more often if: Your lipid or cholesterol levels are high. You are older than 70 years of age. You are at high risk for heart disease. What should I know about cancer screening? Depending on your health history and family history, you may need to have cancer screening at various ages. This may include  screening for: Breast cancer. Cervical cancer. Colorectal cancer. Skin cancer. Lung cancer. What should I know about heart disease, diabetes, and high blood pressure? Blood pressure and heart disease High blood pressure causes heart disease and increases the risk of stroke. This is more likely to develop in people who have high blood pressure readings or are overweight. Have your blood pressure checked: Every 3-5 years if you are 51-21 years of age. Every year if you are 36 years old or older. Diabetes Have regular diabetes screenings. This checks your fasting blood sugar level. Have the screening done: Once every three years after age 49 if you are at a normal weight and have a low risk for diabetes. More often and at a younger age if you are overweight or have a high risk for diabetes. What should I know about preventing infection? Hepatitis B If you have a higher risk for hepatitis B, you should be screened for this virus. Talk with your health care provider to find out if you are at risk for hepatitis B infection. Hepatitis C Testing is recommended for: Everyone born from [birth year] through 1967. Anyone with known risk factors for hepatitis C. Sexually transmitted infections (STIs) Get screened for STIs, including gonorrhea and chlamydia, if: You are sexually active and are younger than 70 years of age. You are older than 70 years of age and your health care provider tells you that you are at risk for this type of infection. Your sexual activity has changed since you were last screened,  and you are at increased risk for chlamydia or gonorrhea. Ask your health care provider if you are at risk. Ask your health care provider about whether you are at high risk for HIV. Your health care provider may recommend a prescription medicine to help prevent HIV infection. If you choose to take medicine to prevent HIV, you should first get tested for HIV. You should then be tested every 3 months for as  long as you are taking the medicine. Pregnancy If you are about to stop having your period (premenopausal) and you may become pregnant, seek counseling before you get pregnant. Take 400 to 800 micrograms (mcg) of folic acid every day if you become pregnant. Ask for birth control (contraception) if you want to prevent pregnancy. Osteoporosis and menopause Osteoporosis is a disease in which the bones lose minerals and strength with aging. This can result in bone fractures. If you are 62 years old or older, or if you are at risk for osteoporosis and fractures, ask your health care provider if you should: Be screened for bone loss. Take a calcium or vitamin D supplement to lower your risk of fractures. Be given hormone replacement therapy (HRT) to treat symptoms of menopause. Follow these instructions at home: Alcohol use Do not drink alcohol if: Your health care provider tells you not to drink. You are pregnant, may be pregnant, or are planning to become pregnant. If you drink alcohol: Limit how much you have to: 0-1 drink a day. Know how much alcohol is in your drink. In the [place] one drink equals one 12 oz bottle of beer (355 mL), one 5 oz glass of wine (148 mL), or one 1 oz glass of hard liquor (44 mL). Lifestyle Do not use any products that contain nicotine or tobacco. These products include cigarettes, chewing tobacco, and vaping devices, such as e-cigarettes. If you need help quitting, ask your health care provider. Do not use street drugs. Do not share needles. Ask your health care provider for help if you need support or information about quitting drugs. General instructions Schedule regular health, dental, and eye exams. Stay current with your vaccines. Tell your health care provider if: You often feel depressed. You have ever been abused or do not feel safe at home. Summary Adopting a healthy lifestyle and getting preventive care are important in promoting health and  wellness. Follow your health care provider's instructions about healthy diet, exercising, and getting tested or screened for diseases. Follow your health care provider's instructions on monitoring your cholesterol and blood pressure. This information is not intended to replace advice given to you by your health care provider. Make sure you discuss any questions you have with your health care provider. Document Revised: 03/31/2021 Document Reviewed: 03/31/2021 Elsevier Patient Education  2024 [employer].

## 2024-10-01 NOTE — Progress Notes (Unsigned)
 Subjective:    Patient ID: Sheila Solomon, female    DOB: 1956-06-13, 68 y.o.   MRN: [id number]      HPI Sheila Solomon is here for a Physical exam and her chronic medical problems.   Needs asa and statin - cac 411 in Lcx Moder asym lvh of basal-septal segment  --- NEEDS bp medication  She did see Dr Raford 02/2024.   Medications and allergies reviewed with patient and updated if appropriate.  Current Outpatient Medications on File Prior to Visit  Medication Sig Dispense Refill   aspirin EC 81 MG tablet Take 81 mg by mouth daily. Swallow whole. (Patient not taking: Reported on 03/17/2024)     calcium carbonate (OS-CAL) 600 MG TABS tablet Take 600 mg by mouth 2 (two) times daily with a meal.     Cholecalciferol (VITAMIN D3) 2000 UNITS TABS Take 1 tablet by mouth daily.     MAGNESIUM PO Take by mouth.     Multiple Vitamin (MULTIVITAMIN) tablet Take 1 tablet by mouth daily.     POTASSIUM PO Take by mouth.     vitamin C (ASCORBIC ACID) 500 MG tablet Take 500 mg by mouth as needed.     No current facility-administered medications on file prior to visit.    Review of Systems     Objective:  There were no vitals filed for this visit. There were no vitals filed for this visit. There is no height or weight on file to calculate BMI.  BP Readings from Last 3 Encounters:  03/17/24 (!) 148/92  08/27/23 (!) 154/74  10/05/22 (!) 140/100    Wt Readings from Last 3 Encounters:  03/17/24 194 lb (88 kg)  02/24/24 187 lb (84.8 kg)  08/27/23 194 lb (88 kg)       Physical Exam Constitutional: She appears well-developed and well-nourished. No distress.  HENT:  Head: Normocephalic and atraumatic.  Right Ear: External ear normal. Normal ear canal and TM Left Ear: External ear normal.  Normal ear canal and TM Mouth/Throat: Oropharynx is clear and moist.  Eyes: Conjunctivae normal.  Neck: Neck supple. No tracheal deviation present. No thyromegaly present.  No carotid bruit   Cardiovascular: Normal rate, regular rhythm and normal heart sounds.   No murmur heard.  No edema. Pulmonary/Chest: Effort normal and breath sounds normal. No respiratory distress. She has no wheezes. She has no rales.  Breast: deferred   Abdominal: Soft. She exhibits no distension. There is no tenderness.  Lymphadenopathy: She has no cervical adenopathy.  Skin: Skin is warm and dry. She is not diaphoretic.  Psychiatric: She has a normal mood and affect. Her behavior is normal.     Lab Results  Component Value Date   WBC 9.5 10/05/2022   HGB 13.7 10/05/2022   HCT 41.1 10/05/2022   PLT 271.0 10/05/2022   GLUCOSE 100 (H) 08/27/2023   CHOL 215 (H) 08/27/2023   TRIG 72.0 08/27/2023   HDL 54.90 08/27/2023   LDLDIRECT 145.6 01/08/2012   LDLCALC 146 (H) 08/27/2023   ALT 13 08/27/2023   AST 17 08/27/2023   NA 140 08/27/2023   K 4.3 08/27/2023   CL 104 08/27/2023   CREATININE 0.94 08/27/2023   BUN 16 08/27/2023   CO2 28 08/27/2023   TSH 1.70 10/05/2022   HGBA1C 5.6 08/27/2023         Assessment & Plan:   Physical exam: Screening blood work  ordered Exercise   Weight   Substance abuse  none  Reviewed recommended immunizations.   Health Maintenance  Topic Date Due   Pneumococcal Vaccine: 50+ Years (1 of 2 - PCV) Never done   Zoster Vaccines- Shingrix (1 of 2) Never done   DTaP/Tdap/Td (2 - Tdap) 11/13/2017   Influenza Vaccine  06/23/2024   COVID-19 Vaccine (2 - 2025-26 season) 07/24/2024   Medicare Annual Wellness (AWV)  02/23/2025   Mammogram  04/14/2026   DEXA SCAN  07/19/2027   Colonoscopy  02/19/2028   Hepatitis C Screening  Completed   Meningococcal B Vaccine  Aged Out          See Problem List for Assessment and Plan of chronic medical problems.

## 2024-10-02 ENCOUNTER — Other Ambulatory Visit

## 2024-10-02 ENCOUNTER — Ambulatory Visit (INDEPENDENT_AMBULATORY_CARE_PROVIDER_SITE_OTHER): Admitting: Internal Medicine

## 2024-10-02 VITALS — BP 130/82 | HR 69 | Temp 98.3°F | Ht 66.0 in | Wt 192.0 lb

## 2024-10-02 DIAGNOSIS — I10 Essential (primary) hypertension: Secondary | ICD-10-CM | POA: Diagnosis not present

## 2024-10-02 DIAGNOSIS — I77810 Thoracic aortic ectasia: Secondary | ICD-10-CM

## 2024-10-02 DIAGNOSIS — E559 Vitamin D deficiency, unspecified: Secondary | ICD-10-CM

## 2024-10-02 DIAGNOSIS — R7303 Prediabetes: Secondary | ICD-10-CM | POA: Diagnosis not present

## 2024-10-02 DIAGNOSIS — E7800 Pure hypercholesterolemia, unspecified: Secondary | ICD-10-CM | POA: Diagnosis not present

## 2024-10-02 DIAGNOSIS — M85861 Other specified disorders of bone density and structure, right lower leg: Secondary | ICD-10-CM

## 2024-10-02 DIAGNOSIS — I2510 Atherosclerotic heart disease of native coronary artery without angina pectoris: Secondary | ICD-10-CM

## 2024-10-02 DIAGNOSIS — E669 Obesity, unspecified: Secondary | ICD-10-CM

## 2024-10-02 DIAGNOSIS — Z0000 Encounter for general adult medical examination without abnormal findings: Secondary | ICD-10-CM

## 2024-10-02 DIAGNOSIS — I517 Cardiomegaly: Secondary | ICD-10-CM

## 2024-10-02 LAB — LIPID PANEL
Cholesterol: 221 mg/dL — ABNORMAL HIGH (ref 0–200)
HDL: 52.2 mg/dL (ref >39.00)
LDL Cholesterol: 150 mg/dL — ABNORMAL HIGH (ref 0–99)
NonHDL: 168.92
Total CHOL/HDL Ratio: 4
Triglycerides: 96 mg/dL (ref 0.0–149.0)
VLDL: 19.2 mg/dL (ref 0.0–40.0)

## 2024-10-02 LAB — HEMOGLOBIN A1C: Hgb A1c MFr Bld: 5.8 % (ref 4.6–6.5)

## 2024-10-02 LAB — CBC
HCT: 41.8 % (ref 36.0–46.0)
Hemoglobin: 14.1 g/dL (ref 12.0–15.0)
MCHC: 33.7 g/dL (ref 30.0–36.0)
MCV: 90.8 fl (ref 78.0–100.0)
Platelets: 276 K/uL (ref 150.0–400.0)
RBC: 4.6 Mil/uL (ref 3.87–5.11)
RDW: 12.9 % (ref 11.5–15.5)
WBC: 7.7 K/uL (ref 4.0–10.5)

## 2024-10-02 LAB — COMPREHENSIVE METABOLIC PANEL WITH GFR
ALT: 13 U/L (ref 0–35)
AST: 16 U/L (ref 0–37)
Albumin: 4.2 g/dL (ref 3.5–5.2)
Alkaline Phosphatase: 48 U/L (ref 39–117)
BUN: 13 mg/dL (ref 6–23)
CO2: 29 meq/L (ref 19–32)
Calcium: 9.5 mg/dL (ref 8.4–10.5)
Chloride: 103 meq/L (ref 96–112)
Creatinine, Ser: 0.89 mg/dL (ref 0.40–1.20)
GFR: 65.8 mL/min (ref >60.00)
Glucose, Bld: 89 mg/dL (ref 70–99)
Potassium: 3.9 meq/L (ref 3.5–5.1)
Sodium: 138 meq/L (ref 135–145)
Total Bilirubin: 0.8 mg/dL (ref 0.2–1.2)
Total Protein: 7.5 g/dL (ref 6.0–8.3)

## 2024-10-02 LAB — TSH: TSH: 1.58 u[IU]/mL (ref 0.35–5.50)

## 2024-10-02 LAB — VITAMIN D 25 HYDROXY (VIT D DEFICIENCY, FRACTURES): VITD: 31.96 ng/mL (ref 30.00–100.00)

## 2024-10-02 NOTE — Assessment & Plan Note (Signed)
 Chronic Borderline ascending aorta dilation Stressed good blood pressure control

## 2024-10-02 NOTE — Assessment & Plan Note (Signed)
 Chronic Moderate asymmetric LVH, grade 1 DD Stressed the importance of good blood pressure control to reduce the risk of heart failure

## 2024-10-02 NOTE — Assessment & Plan Note (Signed)
 Chronic Lab Results  Component Value Date   LDLCALC 146 (H) 08/27/2023   LDL above goal-discussed goal of less than 70 given newly diagnosed CAD Stressed regular exercise, healthy diet Advised starting a statin

## 2024-10-02 NOTE — Assessment & Plan Note (Signed)
 Chronic Blood pressure consistently elevated in our system BP at home Discussed echo that shows LVH and that her blood pressure has caused her heart muscle become thickened and the importance of starting medication Would like to work on lifestyle Continue to monitor BP

## 2024-10-02 NOTE — Assessment & Plan Note (Addendum)
 Chronic BMI 30.9 Continue regular exercise She is eating healthy Decreased portions may help with weight loss

## 2024-10-02 NOTE — Assessment & Plan Note (Signed)
 Chronic Lab Results  Component Value Date   HGBA1C 5.6 08/27/2023   Check a1c Low sugar / carb diet Stressed regular exercise

## 2024-10-02 NOTE — Assessment & Plan Note (Addendum)
 Chronic Asymptomatic 08/2023: CT CAC score 411-plaque in left circumflex  Reviewed test and discussed risk of heart attack Advised starting statin, aspirin 81 mg daily - will start ASA  - deferred statin Stressed regular exercise, healthy diet, maintaining good weight

## 2024-10-02 NOTE — Assessment & Plan Note (Signed)
 Chronic Taking vitamin d daily Check vitamin d level

## 2024-10-02 NOTE — Assessment & Plan Note (Addendum)
 Chronic Dexa up to date-recheck in 2 years Continue calcium and vitamin d  daily Stressed regular exercise

## 2024-10-07 ENCOUNTER — Ambulatory Visit: Payer: Self-pay | Admitting: Internal Medicine

## 2024-12-14 ENCOUNTER — Telehealth: Payer: Self-pay

## 2024-12-14 NOTE — Telephone Encounter (Signed)
 Copied from CRM #[id number]. Topic: Clinical - Medical Advice >> Dec 14, 2024 12:36 PM Winona R wrote: Pt would like to speak with Dr. Geofm or her nurse about a medication supplement that she rather not share with me

## 2024-12-15 NOTE — Telephone Encounter (Signed)
 Calcium citrate is the best calcium to take

## 2024-12-20 NOTE — Telephone Encounter (Signed)
Spoke with patient today and recommendation given.

## 2025-02-28 ENCOUNTER — Ambulatory Visit

## 2025-03-23 ENCOUNTER — Ambulatory Visit
